# Patient Record
Sex: Female | Born: 1950 | Race: White | Hispanic: No | Marital: Married | Smoking: Never smoker
Health system: Southern US, Community
[De-identification: ages and names within clinical notes are randomized; demographics above are authoritative.]

## PROBLEM LIST (undated history)

## (undated) DIAGNOSIS — G43909 Migraine, unspecified, not intractable, without status migrainosus: Secondary | ICD-10-CM

## (undated) DIAGNOSIS — I10 Essential (primary) hypertension: Secondary | ICD-10-CM

## (undated) DIAGNOSIS — K589 Irritable bowel syndrome without diarrhea: Secondary | ICD-10-CM

---

## 1998-04-23 ENCOUNTER — Encounter: Payer: Self-pay | Admitting: Obstetrics and Gynecology

## 1998-04-23 ENCOUNTER — Ambulatory Visit (HOSPITAL_COMMUNITY): Admission: RE | Admit: 1998-04-23 | Discharge: 1998-04-23 | Payer: Self-pay | Admitting: Obstetrics and Gynecology

## 1999-06-14 ENCOUNTER — Encounter: Payer: Self-pay | Admitting: Obstetrics and Gynecology

## 1999-06-14 ENCOUNTER — Ambulatory Visit (HOSPITAL_COMMUNITY): Admission: RE | Admit: 1999-06-14 | Discharge: 1999-06-14 | Payer: Self-pay | Admitting: Obstetrics and Gynecology

## 2000-07-30 ENCOUNTER — Encounter: Payer: Self-pay | Admitting: Obstetrics and Gynecology

## 2000-07-30 ENCOUNTER — Ambulatory Visit (HOSPITAL_COMMUNITY): Admission: RE | Admit: 2000-07-30 | Discharge: 2000-07-30 | Payer: Self-pay | Admitting: Obstetrics and Gynecology

## 2001-08-05 ENCOUNTER — Encounter: Payer: Self-pay | Admitting: Obstetrics and Gynecology

## 2001-08-05 ENCOUNTER — Ambulatory Visit (HOSPITAL_COMMUNITY): Admission: RE | Admit: 2001-08-05 | Discharge: 2001-08-05 | Payer: Self-pay | Admitting: Obstetrics and Gynecology

## 2002-09-01 ENCOUNTER — Encounter: Payer: Self-pay | Admitting: Obstetrics and Gynecology

## 2002-09-01 ENCOUNTER — Ambulatory Visit (HOSPITAL_COMMUNITY): Admission: RE | Admit: 2002-09-01 | Discharge: 2002-09-01 | Payer: Self-pay | Admitting: Obstetrics and Gynecology

## 2003-10-10 ENCOUNTER — Ambulatory Visit (HOSPITAL_COMMUNITY): Admission: RE | Admit: 2003-10-10 | Discharge: 2003-10-10 | Payer: Self-pay | Admitting: Obstetrics and Gynecology

## 2004-10-16 ENCOUNTER — Ambulatory Visit (HOSPITAL_COMMUNITY): Admission: RE | Admit: 2004-10-16 | Discharge: 2004-10-16 | Payer: Self-pay | Admitting: Obstetrics and Gynecology

## 2005-10-28 ENCOUNTER — Ambulatory Visit (HOSPITAL_COMMUNITY): Admission: RE | Admit: 2005-10-28 | Discharge: 2005-10-28 | Payer: Self-pay | Admitting: Obstetrics and Gynecology

## 2005-10-28 IMAGING — MG MM DIGITAL SCREENING BILAT
5 series · 5 of 5 positions shown · non-contrast
Comparison: none

DG SCREEN MAMMOGRAM BILATERAL
Bilateral CC and MLO view(s) were taken.

SCREENING MAMMOGRAM:
There is a  dense fibroglandular pattern.  No masses or malignant type calcifications are 
identified.  Compared with prior studies.

[R CC]
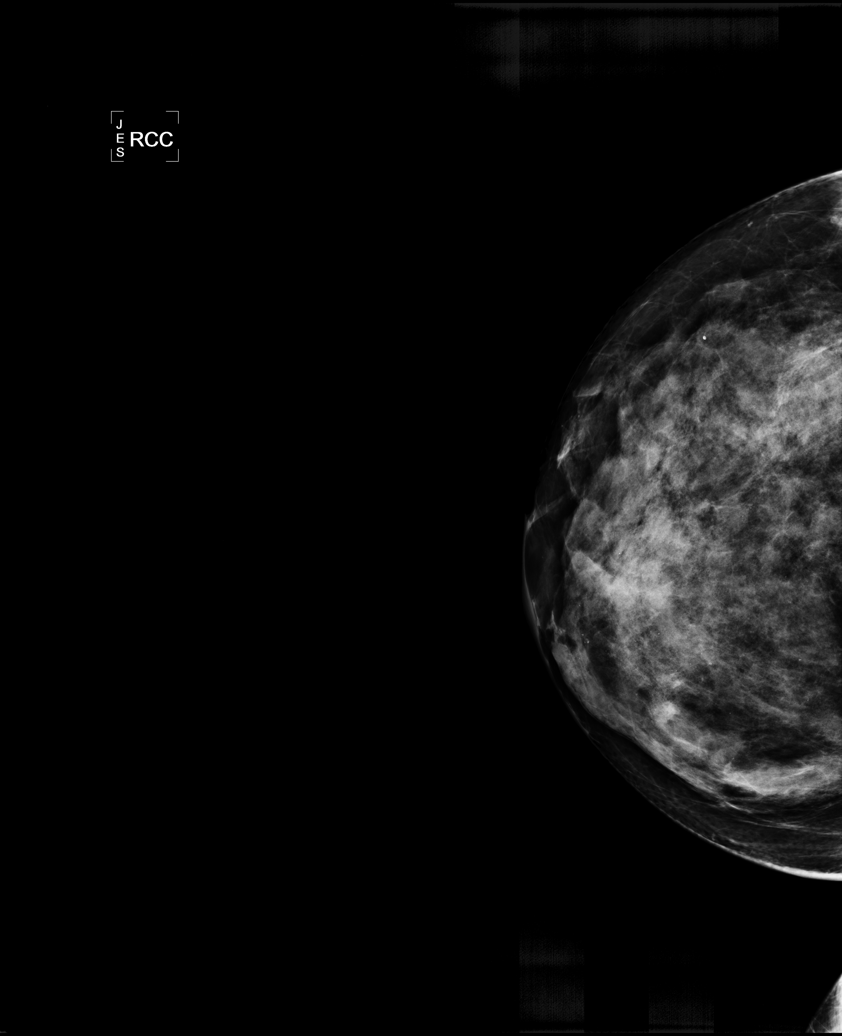

[R MLO]
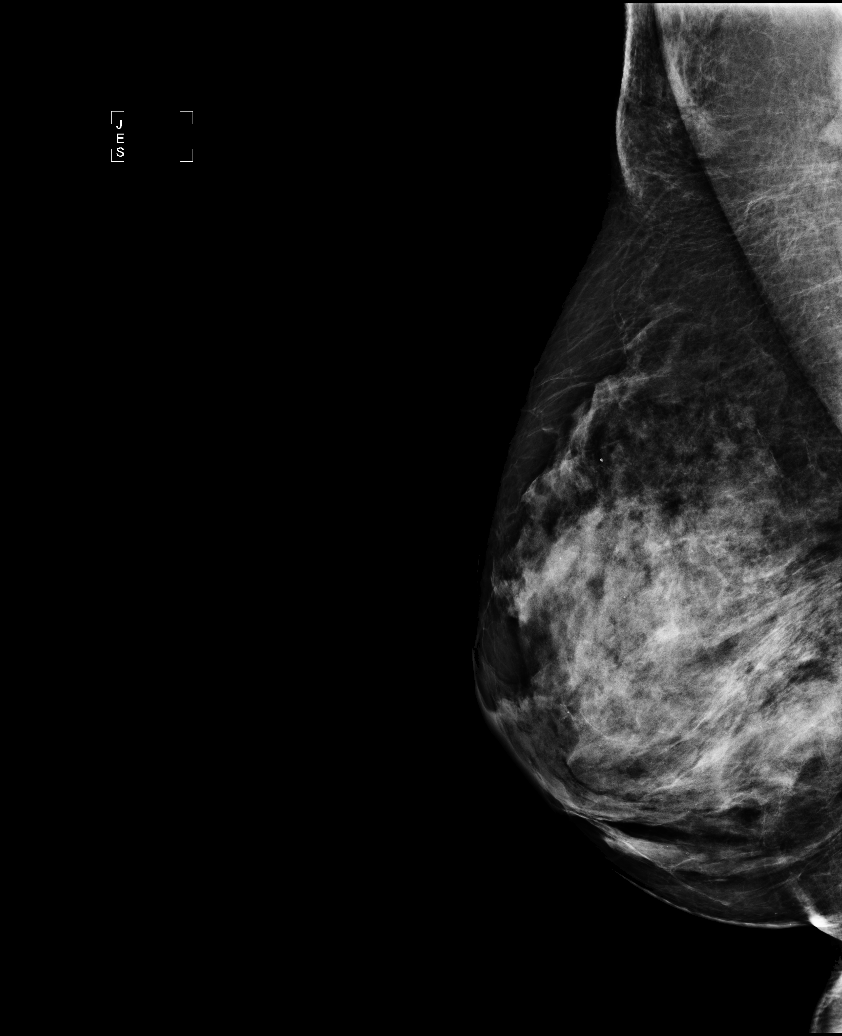

[L CC]
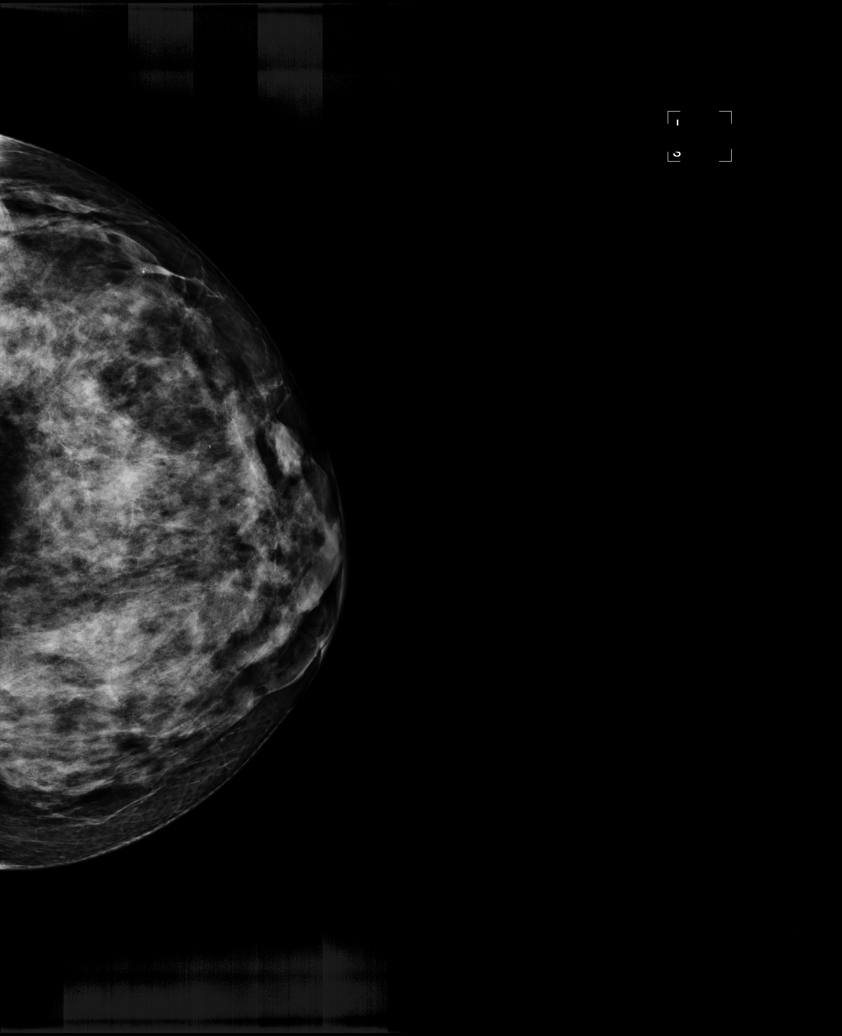

[L MLO (1 of 2)]
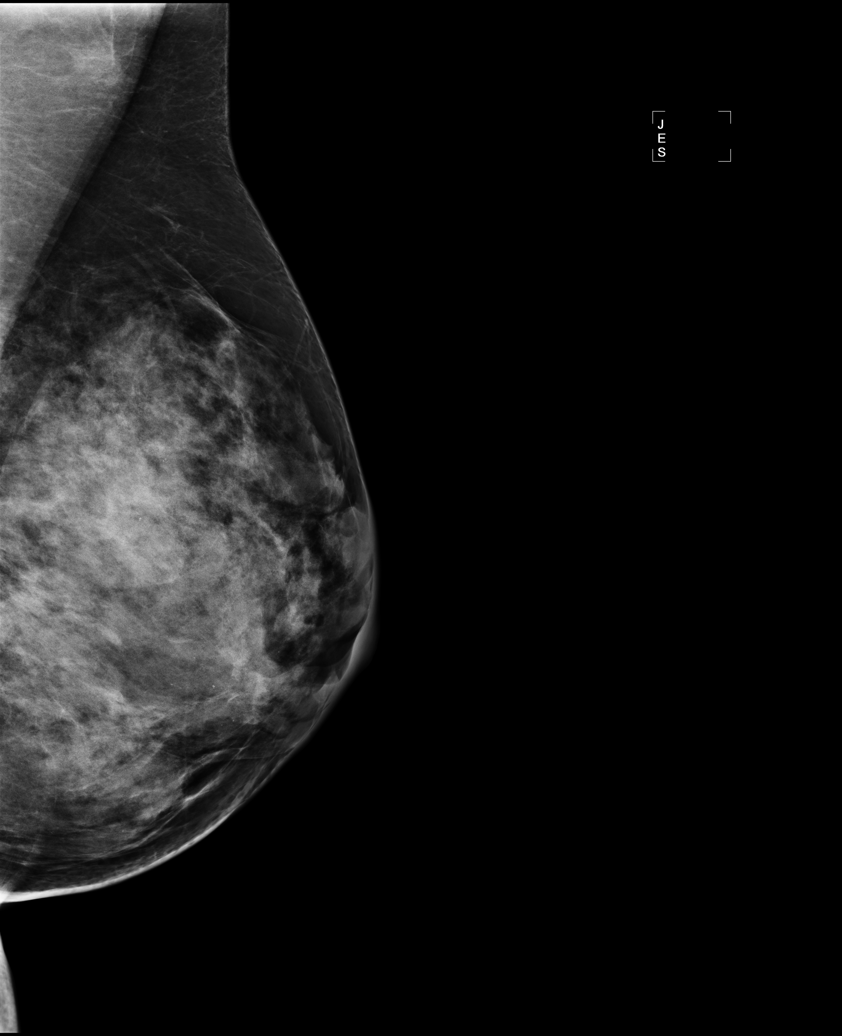

[L MLO (2 of 2)]
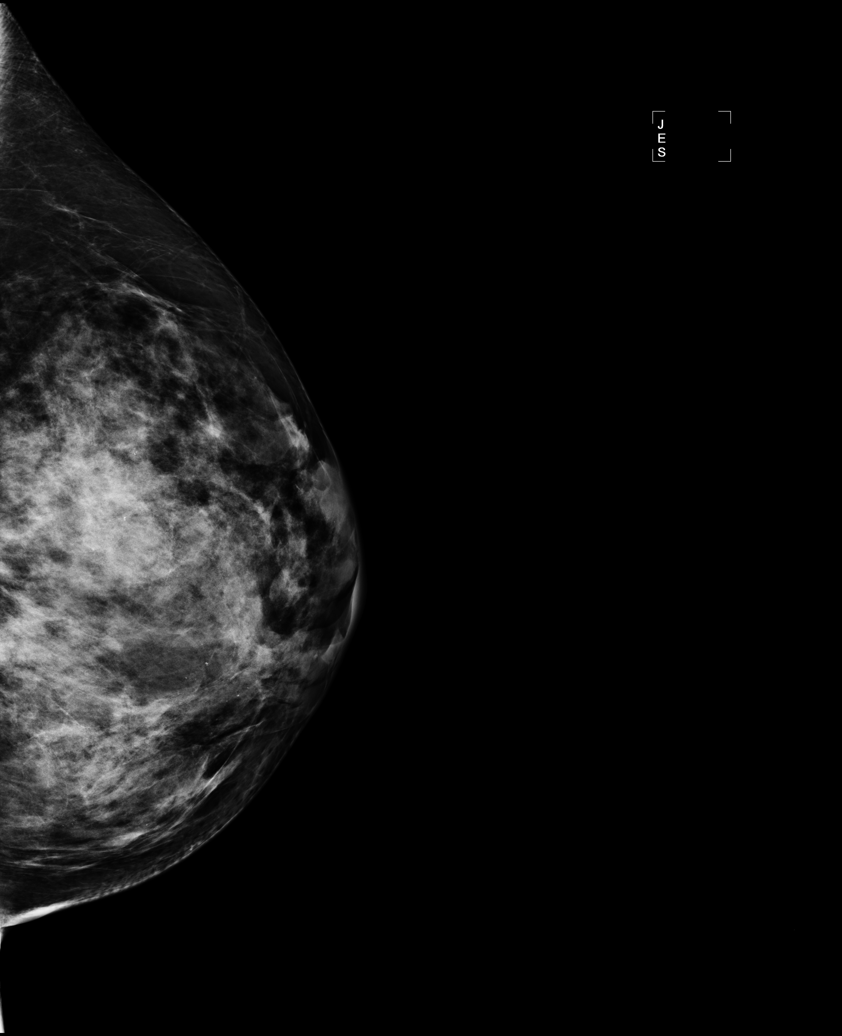

[5 of 5 positions shown; findings below may reference images not displayed]

IMPRESSION: No specific mammographic evidence of malignancy.  Next screening mammogram is recommended in one 
year.

ASSESSMENT: Negative - BI-RADS 1

Screening mammogram in 1 year.
ANALYZED BY COMPUTER AIDED DETECTION. , THIS PROCEDURE WAS A DIGITAL MAMMOGRAM.

## 2006-11-13 ENCOUNTER — Ambulatory Visit (HOSPITAL_COMMUNITY): Admission: RE | Admit: 2006-11-13 | Discharge: 2006-11-13 | Payer: Self-pay | Admitting: Family Medicine

## 2009-08-29 ENCOUNTER — Ambulatory Visit (HOSPITAL_COMMUNITY): Admission: RE | Admit: 2009-08-29 | Discharge: 2009-08-29 | Payer: Self-pay | Admitting: Obstetrics and Gynecology

## 2009-09-04 ENCOUNTER — Encounter: Admission: RE | Admit: 2009-09-04 | Discharge: 2009-09-04 | Payer: Self-pay | Admitting: Obstetrics and Gynecology

## 2011-04-07 ENCOUNTER — Other Ambulatory Visit (HOSPITAL_COMMUNITY): Payer: Self-pay | Admitting: Obstetrics and Gynecology

## 2011-04-07 DIAGNOSIS — Z1231 Encounter for screening mammogram for malignant neoplasm of breast: Secondary | ICD-10-CM

## 2011-05-06 ENCOUNTER — Ambulatory Visit (HOSPITAL_COMMUNITY)
Admission: RE | Admit: 2011-05-06 | Discharge: 2011-05-06 | Disposition: A | Discharge status: Home / Self Care | Payer: BC Managed Care – PPO | Source: Ambulatory Visit | Attending: Obstetrics and Gynecology | Admitting: Obstetrics and Gynecology

## 2011-05-06 DIAGNOSIS — Z1231 Encounter for screening mammogram for malignant neoplasm of breast: Secondary | ICD-10-CM | POA: Insufficient documentation

## 2012-06-17 ENCOUNTER — Other Ambulatory Visit (HOSPITAL_COMMUNITY): Payer: Self-pay | Admitting: Obstetrics and Gynecology

## 2012-06-17 DIAGNOSIS — Z1231 Encounter for screening mammogram for malignant neoplasm of breast: Secondary | ICD-10-CM

## 2012-06-18 ENCOUNTER — Ambulatory Visit (HOSPITAL_COMMUNITY)
Admission: RE | Admit: 2012-06-18 | Discharge: 2012-06-18 | Disposition: A | Discharge status: Home / Self Care | Payer: BC Managed Care – PPO | Source: Ambulatory Visit | Attending: Obstetrics and Gynecology | Admitting: Obstetrics and Gynecology

## 2012-06-18 DIAGNOSIS — Z1231 Encounter for screening mammogram for malignant neoplasm of breast: Secondary | ICD-10-CM | POA: Insufficient documentation

## 2017-06-02 DIAGNOSIS — Z780 Asymptomatic menopausal state: Secondary | ICD-10-CM | POA: Diagnosis not present

## 2017-06-02 DIAGNOSIS — Z1231 Encounter for screening mammogram for malignant neoplasm of breast: Secondary | ICD-10-CM | POA: Diagnosis not present

## 2017-06-02 DIAGNOSIS — Z13820 Encounter for screening for osteoporosis: Secondary | ICD-10-CM | POA: Diagnosis not present

## 2017-06-02 DIAGNOSIS — M85852 Other specified disorders of bone density and structure, left thigh: Secondary | ICD-10-CM | POA: Diagnosis not present

## 2017-09-23 DIAGNOSIS — Z0000 Encounter for general adult medical examination without abnormal findings: Secondary | ICD-10-CM | POA: Diagnosis not present

## 2017-09-23 DIAGNOSIS — G43001 Migraine without aura, not intractable, with status migrainosus: Secondary | ICD-10-CM | POA: Diagnosis not present

## 2017-09-23 DIAGNOSIS — R7989 Other specified abnormal findings of blood chemistry: Secondary | ICD-10-CM | POA: Diagnosis not present

## 2017-09-23 DIAGNOSIS — I10 Essential (primary) hypertension: Secondary | ICD-10-CM | POA: Diagnosis not present

## 2017-12-02 DIAGNOSIS — R1032 Left lower quadrant pain: Secondary | ICD-10-CM | POA: Diagnosis not present

## 2017-12-02 DIAGNOSIS — Z01419 Encounter for gynecological examination (general) (routine) without abnormal findings: Secondary | ICD-10-CM | POA: Diagnosis not present

## 2017-12-02 DIAGNOSIS — Z1151 Encounter for screening for human papillomavirus (HPV): Secondary | ICD-10-CM | POA: Diagnosis not present

## 2017-12-02 DIAGNOSIS — Z800 Family history of malignant neoplasm of digestive organs: Secondary | ICD-10-CM | POA: Diagnosis not present

## 2017-12-15 DIAGNOSIS — Z79899 Other long term (current) drug therapy: Secondary | ICD-10-CM | POA: Diagnosis not present

## 2017-12-15 DIAGNOSIS — I10 Essential (primary) hypertension: Secondary | ICD-10-CM | POA: Diagnosis not present

## 2017-12-15 DIAGNOSIS — D259 Leiomyoma of uterus, unspecified: Secondary | ICD-10-CM | POA: Diagnosis not present

## 2017-12-15 DIAGNOSIS — R1032 Left lower quadrant pain: Secondary | ICD-10-CM | POA: Diagnosis not present

## 2017-12-16 DIAGNOSIS — I10 Essential (primary) hypertension: Secondary | ICD-10-CM | POA: Diagnosis not present

## 2017-12-16 DIAGNOSIS — M542 Cervicalgia: Secondary | ICD-10-CM | POA: Diagnosis not present

## 2017-12-31 ENCOUNTER — Other Ambulatory Visit: Payer: Self-pay | Admitting: *Deleted

## 2017-12-31 NOTE — Patient Outreach (Addendum)
12/28/17-Telephone call to pt for follow up on [place] (HTA) completed by pt, spoke with pt, HIPAA verified, pt reports she sees primary care MD in [place], reports she does not smoke, does have hypertension and has had some medication changes and has blood pressure cuff and is checking blood pressure, and keeping in touch with MD for any elevated readings, changes pt verbalizes she is able to follow MD instructions about any medication changes, verbalizes understanding of low sodium diet, pt reports she has no needs at present but would like contact information for future reference, RN CM mailed successful outreach letter to pt home (with 24 hour nurse line magnet)  Jacqlyn Larsen [facility], [place] Coordinator [phone]

## 2018-01-15 DIAGNOSIS — I10 Essential (primary) hypertension: Secondary | ICD-10-CM | POA: Diagnosis not present

## 2018-01-15 DIAGNOSIS — M542 Cervicalgia: Secondary | ICD-10-CM | POA: Diagnosis not present

## 2018-07-19 DIAGNOSIS — M542 Cervicalgia: Secondary | ICD-10-CM | POA: Diagnosis not present

## 2018-07-19 DIAGNOSIS — I10 Essential (primary) hypertension: Secondary | ICD-10-CM | POA: Diagnosis not present

## 2018-07-19 DIAGNOSIS — Z1239 Encounter for other screening for malignant neoplasm of breast: Secondary | ICD-10-CM | POA: Diagnosis not present

## 2018-07-19 DIAGNOSIS — R7301 Impaired fasting glucose: Secondary | ICD-10-CM | POA: Diagnosis not present

## 2018-07-19 DIAGNOSIS — Z1159 Encounter for screening for other viral diseases: Secondary | ICD-10-CM | POA: Diagnosis not present

## 2018-07-23 DIAGNOSIS — M542 Cervicalgia: Secondary | ICD-10-CM | POA: Diagnosis not present

## 2018-07-23 DIAGNOSIS — M50323 Other cervical disc degeneration at C6-C7 level: Secondary | ICD-10-CM | POA: Diagnosis not present

## 2018-07-23 DIAGNOSIS — Z1239 Encounter for other screening for malignant neoplasm of breast: Secondary | ICD-10-CM | POA: Diagnosis not present

## 2018-07-23 DIAGNOSIS — Z1231 Encounter for screening mammogram for malignant neoplasm of breast: Secondary | ICD-10-CM | POA: Diagnosis not present

## 2019-01-18 DIAGNOSIS — I10 Essential (primary) hypertension: Secondary | ICD-10-CM | POA: Diagnosis not present

## 2019-01-18 DIAGNOSIS — Z0000 Encounter for general adult medical examination without abnormal findings: Secondary | ICD-10-CM | POA: Diagnosis not present

## 2019-01-18 DIAGNOSIS — Z23 Encounter for immunization: Secondary | ICD-10-CM | POA: Diagnosis not present

## 2019-01-18 DIAGNOSIS — R7989 Other specified abnormal findings of blood chemistry: Secondary | ICD-10-CM | POA: Diagnosis not present

## 2019-01-18 DIAGNOSIS — R7301 Impaired fasting glucose: Secondary | ICD-10-CM | POA: Diagnosis not present

## 2019-02-08 DIAGNOSIS — D0359 Melanoma in situ of other part of trunk: Secondary | ICD-10-CM | POA: Diagnosis not present

## 2019-02-08 DIAGNOSIS — L821 Other seborrheic keratosis: Secondary | ICD-10-CM | POA: Diagnosis not present

## 2019-02-08 DIAGNOSIS — L570 Actinic keratosis: Secondary | ICD-10-CM | POA: Diagnosis not present

## 2019-02-08 DIAGNOSIS — D3617 Benign neoplasm of peripheral nerves and autonomic nervous system of trunk, unspecified: Secondary | ICD-10-CM | POA: Diagnosis not present

## 2019-02-08 DIAGNOSIS — C4441 Basal cell carcinoma of skin of scalp and neck: Secondary | ICD-10-CM | POA: Diagnosis not present

## 2019-02-08 DIAGNOSIS — D485 Neoplasm of uncertain behavior of skin: Secondary | ICD-10-CM | POA: Diagnosis not present

## 2019-02-21 DIAGNOSIS — C4492 Squamous cell carcinoma of skin, unspecified: Secondary | ICD-10-CM | POA: Diagnosis not present

## 2019-03-15 DIAGNOSIS — D0359 Melanoma in situ of other part of trunk: Secondary | ICD-10-CM | POA: Diagnosis not present

## 2019-03-15 DIAGNOSIS — L820 Inflamed seborrheic keratosis: Secondary | ICD-10-CM | POA: Diagnosis not present

## 2019-03-15 DIAGNOSIS — D485 Neoplasm of uncertain behavior of skin: Secondary | ICD-10-CM | POA: Diagnosis not present

## 2019-04-26 DIAGNOSIS — L821 Other seborrheic keratosis: Secondary | ICD-10-CM | POA: Diagnosis not present

## 2019-04-26 DIAGNOSIS — Z85820 Personal history of malignant melanoma of skin: Secondary | ICD-10-CM | POA: Diagnosis not present

## 2019-04-26 DIAGNOSIS — Z08 Encounter for follow-up examination after completed treatment for malignant neoplasm: Secondary | ICD-10-CM | POA: Diagnosis not present

## 2019-04-26 DIAGNOSIS — L570 Actinic keratosis: Secondary | ICD-10-CM | POA: Diagnosis not present

## 2019-07-28 DIAGNOSIS — C44519 Basal cell carcinoma of skin of other part of trunk: Secondary | ICD-10-CM | POA: Diagnosis not present

## 2019-07-28 DIAGNOSIS — L570 Actinic keratosis: Secondary | ICD-10-CM | POA: Diagnosis not present

## 2019-10-06 DIAGNOSIS — L570 Actinic keratosis: Secondary | ICD-10-CM | POA: Diagnosis not present

## 2019-10-06 DIAGNOSIS — D485 Neoplasm of uncertain behavior of skin: Secondary | ICD-10-CM | POA: Diagnosis not present

## 2019-10-06 DIAGNOSIS — L905 Scar conditions and fibrosis of skin: Secondary | ICD-10-CM | POA: Diagnosis not present

## 2019-10-25 DIAGNOSIS — R7301 Impaired fasting glucose: Secondary | ICD-10-CM | POA: Diagnosis not present

## 2019-10-25 DIAGNOSIS — F4323 Adjustment disorder with mixed anxiety and depressed mood: Secondary | ICD-10-CM | POA: Diagnosis not present

## 2019-10-25 DIAGNOSIS — I10 Essential (primary) hypertension: Secondary | ICD-10-CM | POA: Diagnosis not present

## 2019-10-25 DIAGNOSIS — C4359 Malignant melanoma of other part of trunk: Secondary | ICD-10-CM | POA: Diagnosis not present

## 2019-11-15 DIAGNOSIS — Z85828 Personal history of other malignant neoplasm of skin: Secondary | ICD-10-CM | POA: Diagnosis not present

## 2019-11-15 DIAGNOSIS — L570 Actinic keratosis: Secondary | ICD-10-CM | POA: Diagnosis not present

## 2019-11-15 DIAGNOSIS — Z85820 Personal history of malignant melanoma of skin: Secondary | ICD-10-CM | POA: Diagnosis not present

## 2019-11-15 DIAGNOSIS — L821 Other seborrheic keratosis: Secondary | ICD-10-CM | POA: Diagnosis not present

## 2019-11-15 DIAGNOSIS — D1801 Hemangioma of skin and subcutaneous tissue: Secondary | ICD-10-CM | POA: Diagnosis not present

## 2020-01-03 DIAGNOSIS — Z1231 Encounter for screening mammogram for malignant neoplasm of breast: Secondary | ICD-10-CM | POA: Diagnosis not present

## 2020-03-27 DIAGNOSIS — L821 Other seborrheic keratosis: Secondary | ICD-10-CM | POA: Diagnosis not present

## 2020-03-27 DIAGNOSIS — D485 Neoplasm of uncertain behavior of skin: Secondary | ICD-10-CM | POA: Diagnosis not present

## 2020-03-27 DIAGNOSIS — L820 Inflamed seborrheic keratosis: Secondary | ICD-10-CM | POA: Diagnosis not present

## 2020-05-15 DIAGNOSIS — L821 Other seborrheic keratosis: Secondary | ICD-10-CM | POA: Diagnosis not present

## 2020-05-15 DIAGNOSIS — Z85820 Personal history of malignant melanoma of skin: Secondary | ICD-10-CM | POA: Diagnosis not present

## 2020-05-15 DIAGNOSIS — D225 Melanocytic nevi of trunk: Secondary | ICD-10-CM | POA: Diagnosis not present

## 2020-06-06 DIAGNOSIS — R7301 Impaired fasting glucose: Secondary | ICD-10-CM | POA: Diagnosis not present

## 2020-06-06 DIAGNOSIS — B009 Herpesviral infection, unspecified: Secondary | ICD-10-CM | POA: Diagnosis not present

## 2020-06-06 DIAGNOSIS — H9192 Unspecified hearing loss, left ear: Secondary | ICD-10-CM | POA: Diagnosis not present

## 2020-06-06 DIAGNOSIS — I10 Essential (primary) hypertension: Secondary | ICD-10-CM | POA: Diagnosis not present

## 2020-06-06 DIAGNOSIS — Z87828 Personal history of other (healed) physical injury and trauma: Secondary | ICD-10-CM | POA: Diagnosis not present

## 2020-06-06 DIAGNOSIS — R7989 Other specified abnormal findings of blood chemistry: Secondary | ICD-10-CM | POA: Diagnosis not present

## 2020-11-15 DIAGNOSIS — L570 Actinic keratosis: Secondary | ICD-10-CM | POA: Diagnosis not present

## 2020-11-15 DIAGNOSIS — Z85820 Personal history of malignant melanoma of skin: Secondary | ICD-10-CM | POA: Diagnosis not present

## 2020-11-15 DIAGNOSIS — Z85828 Personal history of other malignant neoplasm of skin: Secondary | ICD-10-CM | POA: Diagnosis not present

## 2020-11-15 DIAGNOSIS — L821 Other seborrheic keratosis: Secondary | ICD-10-CM | POA: Diagnosis not present

## 2020-11-15 DIAGNOSIS — L218 Other seborrheic dermatitis: Secondary | ICD-10-CM | POA: Diagnosis not present

## 2020-12-04 DIAGNOSIS — R7989 Other specified abnormal findings of blood chemistry: Secondary | ICD-10-CM | POA: Diagnosis not present

## 2020-12-04 DIAGNOSIS — M25561 Pain in right knee: Secondary | ICD-10-CM | POA: Diagnosis not present

## 2020-12-04 DIAGNOSIS — Z8249 Family history of ischemic heart disease and other diseases of the circulatory system: Secondary | ICD-10-CM | POA: Diagnosis not present

## 2020-12-04 DIAGNOSIS — I10 Essential (primary) hypertension: Secondary | ICD-10-CM | POA: Diagnosis not present

## 2020-12-04 DIAGNOSIS — R7301 Impaired fasting glucose: Secondary | ICD-10-CM | POA: Diagnosis not present

## 2020-12-04 DIAGNOSIS — M25562 Pain in left knee: Secondary | ICD-10-CM | POA: Diagnosis not present

## 2020-12-04 DIAGNOSIS — R7303 Prediabetes: Secondary | ICD-10-CM | POA: Diagnosis not present

## 2020-12-04 DIAGNOSIS — F419 Anxiety disorder, unspecified: Secondary | ICD-10-CM | POA: Diagnosis not present

## 2020-12-04 DIAGNOSIS — Z79899 Other long term (current) drug therapy: Secondary | ICD-10-CM | POA: Diagnosis not present

## 2020-12-04 DIAGNOSIS — M2550 Pain in unspecified joint: Secondary | ICD-10-CM | POA: Diagnosis not present

## 2020-12-04 DIAGNOSIS — Z8639 Personal history of other endocrine, nutritional and metabolic disease: Secondary | ICD-10-CM | POA: Diagnosis not present

## 2020-12-04 DIAGNOSIS — Z0000 Encounter for general adult medical examination without abnormal findings: Secondary | ICD-10-CM | POA: Diagnosis not present

## 2020-12-04 DIAGNOSIS — Z0001 Encounter for general adult medical examination with abnormal findings: Secondary | ICD-10-CM | POA: Diagnosis not present

## 2020-12-04 DIAGNOSIS — H539 Unspecified visual disturbance: Secondary | ICD-10-CM | POA: Diagnosis not present

## 2020-12-04 DIAGNOSIS — H9190 Unspecified hearing loss, unspecified ear: Secondary | ICD-10-CM | POA: Diagnosis not present

## 2021-05-02 DIAGNOSIS — C44712 Basal cell carcinoma of skin of right lower limb, including hip: Secondary | ICD-10-CM | POA: Diagnosis not present

## 2022-10-16 ENCOUNTER — Emergency Department (HOSPITAL_BASED_OUTPATIENT_CLINIC_OR_DEPARTMENT_OTHER): Payer: HMO

## 2022-10-16 ENCOUNTER — Encounter (HOSPITAL_BASED_OUTPATIENT_CLINIC_OR_DEPARTMENT_OTHER): Payer: Self-pay

## 2022-10-16 ENCOUNTER — Inpatient Hospital Stay (HOSPITAL_BASED_OUTPATIENT_CLINIC_OR_DEPARTMENT_OTHER)
Admission: EM | Admit: 2022-10-16 | Discharge: 2022-10-18 | DRG: 390 | Disposition: A | Discharge status: Home / Self Care | Payer: HMO | Attending: Family Medicine | Admitting: Family Medicine

## 2022-10-16 ENCOUNTER — Other Ambulatory Visit: Payer: Self-pay

## 2022-10-16 DIAGNOSIS — Z87891 Personal history of nicotine dependence: Secondary | ICD-10-CM | POA: Diagnosis not present

## 2022-10-16 DIAGNOSIS — F1290 Cannabis use, unspecified, uncomplicated: Secondary | ICD-10-CM | POA: Diagnosis present

## 2022-10-16 DIAGNOSIS — E860 Dehydration: Secondary | ICD-10-CM | POA: Diagnosis present

## 2022-10-16 DIAGNOSIS — K219 Gastro-esophageal reflux disease without esophagitis: Secondary | ICD-10-CM | POA: Diagnosis present

## 2022-10-16 DIAGNOSIS — I10 Essential (primary) hypertension: Secondary | ICD-10-CM | POA: Diagnosis present

## 2022-10-16 DIAGNOSIS — Z800 Family history of malignant neoplasm of digestive organs: Secondary | ICD-10-CM

## 2022-10-16 DIAGNOSIS — D72829 Elevated white blood cell count, unspecified: Secondary | ICD-10-CM | POA: Diagnosis present

## 2022-10-16 DIAGNOSIS — Z79899 Other long term (current) drug therapy: Secondary | ICD-10-CM

## 2022-10-16 DIAGNOSIS — K589 Irritable bowel syndrome without diarrhea: Secondary | ICD-10-CM | POA: Diagnosis present

## 2022-10-16 DIAGNOSIS — K56609 Unspecified intestinal obstruction, unspecified as to partial versus complete obstruction: Principal | ICD-10-CM | POA: Diagnosis present

## 2022-10-16 DIAGNOSIS — K2980 Duodenitis without bleeding: Secondary | ICD-10-CM | POA: Diagnosis present

## 2022-10-16 DIAGNOSIS — R1011 Right upper quadrant pain: Principal | ICD-10-CM

## 2022-10-16 HISTORY — DX: Irritable bowel syndrome, unspecified: K58.9

## 2022-10-16 HISTORY — DX: Essential (primary) hypertension: I10

## 2022-10-16 HISTORY — DX: Migraine, unspecified, not intractable, without status migrainosus: G43.909

## 2022-10-16 LAB — COMPREHENSIVE METABOLIC PANEL
ALT: 27 U/L (ref 0–44)
AST: 34 U/L (ref 15–41)
Albumin: 4.6 g/dL (ref 3.5–5.0)
Alkaline Phosphatase: 81 U/L (ref 38–126)
Anion gap: 12 (ref 5–15)
BUN: 21 mg/dL (ref 8–23)
CO2: 25 mmol/L (ref 22–32)
Calcium: 10.6 mg/dL — ABNORMAL HIGH (ref 8.9–10.3)
Chloride: 101 mmol/L (ref 98–111)
Creatinine, Ser: 0.83 mg/dL (ref 0.44–1.00)
GFR, Estimated: >60 mL/min (ref >60)
Glucose, Bld: 190 mg/dL — ABNORMAL HIGH (ref 70–99)
Potassium: 4.2 mmol/L (ref 3.5–5.1)
Sodium: 138 mmol/L (ref 135–145)
Total Bilirubin: 0.8 mg/dL (ref 0.3–1.2)
Total Protein: 8.2 g/dL — ABNORMAL HIGH (ref 6.5–8.1)

## 2022-10-16 LAB — URINALYSIS, ROUTINE W REFLEX MICROSCOPIC
Bilirubin Urine: NEGATIVE
Glucose, UA: NEGATIVE mg/dL
Hgb urine dipstick: NEGATIVE
Ketones, ur: 15 mg/dL — AB
Leukocytes,Ua: NEGATIVE
Nitrite: NEGATIVE
Protein, ur: 100 mg/dL — AB
Specific Gravity, Urine: 1.02 (ref 1.005–1.030)
pH: 7 (ref 5.0–8.0)

## 2022-10-16 LAB — CBC
HCT: 44.6 % (ref 36.0–46.0)
Hemoglobin: 15.3 g/dL — ABNORMAL HIGH (ref 12.0–15.0)
MCH: 29.8 pg (ref 26.0–34.0)
MCHC: 34.3 g/dL (ref 30.0–36.0)
MCV: 86.9 fL (ref 80.0–100.0)
Platelets: 250 10*3/uL (ref 150–400)
RBC: 5.13 MIL/uL — ABNORMAL HIGH (ref 3.87–5.11)
RDW: 13.2 % (ref 11.5–15.5)
WBC: 15.1 10*3/uL — ABNORMAL HIGH (ref 4.0–10.5)
nRBC: 0 % (ref 0.0–0.2)

## 2022-10-16 LAB — URINALYSIS, MICROSCOPIC (REFLEX)

## 2022-10-16 LAB — LIPASE, BLOOD: Lipase: 41 U/L (ref 11–51)

## 2022-10-16 MED ORDER — ONDANSETRON HCL 4 MG/2ML IJ SOLN
4.0000 mg | Freq: Once | INTRAMUSCULAR | Status: AC
  Administered 2022-10-16: 4 mg via INTRAVENOUS
  Filled 2022-10-16: qty 2

## 2022-10-16 MED ORDER — FENTANYL CITRATE PF 50 MCG/ML IJ SOSY
100.0000 ug | PREFILLED_SYRINGE | Freq: Once | INTRAMUSCULAR | Status: AC
  Administered 2022-10-16: 100 ug via INTRAVENOUS
  Filled 2022-10-16: qty 2

## 2022-10-16 MED ORDER — LACTATED RINGERS IV BOLUS
1000.0000 mL | Freq: Once | INTRAVENOUS | Status: AC
  Administered 2022-10-16: 1000 mL via INTRAVENOUS

## 2022-10-16 MED ORDER — ONDANSETRON HCL 4 MG/2ML IJ SOLN
4.0000 mg | Freq: Four times a day (QID) | INTRAMUSCULAR | Status: DC | PRN
  Administered 2022-10-17: 4 mg via INTRAVENOUS
  Filled 2022-10-16: qty 2

## 2022-10-16 MED ORDER — HYDROMORPHONE HCL 1 MG/ML IJ SOLN
0.5000 mg | Freq: Once | INTRAMUSCULAR | Status: AC
  Administered 2022-10-16: 0.5 mg via INTRAVENOUS
  Filled 2022-10-16: qty 1

## 2022-10-16 MED ORDER — FAMOTIDINE IN NACL 20-0.9 MG/50ML-% IV SOLN
20.0000 mg | Freq: Once | INTRAVENOUS | Status: AC
  Administered 2022-10-16: 20 mg via INTRAVENOUS
  Filled 2022-10-16: qty 50

## 2022-10-16 MED ORDER — PANTOPRAZOLE SODIUM 40 MG IV SOLR
40.0000 mg | Freq: Once | INTRAVENOUS | Status: AC
  Administered 2022-10-16: 40 mg via INTRAVENOUS
  Filled 2022-10-16: qty 10

## 2022-10-16 MED ORDER — MORPHINE SULFATE (PF) 2 MG/ML IV SOLN
1.0000 mg | INTRAVENOUS | Status: DC | PRN
  Administered 2022-10-17: 1 mg via INTRAVENOUS
  Filled 2022-10-16: qty 1

## 2022-10-16 MED ORDER — MORPHINE SULFATE (PF) 4 MG/ML IV SOLN
4.0000 mg | Freq: Once | INTRAVENOUS | Status: AC
  Administered 2022-10-16: 4 mg via INTRAVENOUS
  Filled 2022-10-16: qty 1

## 2022-10-16 MED ORDER — IOHEXOL 350 MG/ML SOLN
100.0000 mL | Freq: Once | INTRAVENOUS | Status: AC | PRN
  Administered 2022-10-16: 75 mL via INTRAVENOUS

## 2022-10-16 MED ORDER — SODIUM CHLORIDE 0.9 % IV BOLUS (SEPSIS)
1000.0000 mL | Freq: Once | INTRAVENOUS | Status: AC
  Administered 2022-10-16: 1000 mL via INTRAVENOUS

## 2022-10-16 MED ORDER — ENOXAPARIN SODIUM 40 MG/0.4ML IJ SOSY: 40.0000 mg | PREFILLED_SYRINGE | INTRAMUSCULAR | Status: DC

## 2022-10-16 MED ORDER — SODIUM CHLORIDE 0.9% FLUSH
3.0000 mL | Freq: Two times a day (BID) | INTRAVENOUS | Status: DC
  Administered 2022-10-17: 3 mL via INTRAVENOUS

## 2022-10-16 MED ORDER — MORPHINE SULFATE (PF) 4 MG/ML IV SOLN
4.0000 mg | Freq: Once | INTRAVENOUS | Status: AC | PRN
  Administered 2022-10-16: 4 mg via INTRAVENOUS
  Filled 2022-10-16: qty 1

## 2022-10-16 MED ORDER — SODIUM CHLORIDE 0.9 % IV SOLN: INTRAVENOUS | Status: AC

## 2022-10-16 MED ORDER — HYDRALAZINE HCL 20 MG/ML IJ SOLN: 10.0000 mg | INTRAMUSCULAR | Status: DC | PRN

## 2022-10-16 NOTE — Assessment & Plan Note (Signed)
Likely reactive in setting of SBO and duodenitis.  Continue to monitor.

## 2022-10-16 NOTE — Assessment & Plan Note (Signed)
Chronic and mild, monitored by her PCP as an outpatient.

## 2022-10-16 NOTE — ED Triage Notes (Signed)
States since 0430 started having epigastric pain with nausea, emesis, belching. Last BM yesterday.

## 2022-10-16 NOTE — H&P (Signed)
History and Physical    Samantha Sampson ZOX:[id number] DOB: 07-Mar-1951 DOA: 10/16/2022  PCP: Shellia Cleverly, PA  Patient coming from: Home  I have personally briefly reviewed patient's old medical records in [facility]  Chief Complaint: Abdominal pain with nausea and vomiting  HPI: Samantha Sampson is a 72 y.o. female with medical history significant for HTN, IBS, chronic mild hypercalcemia who presented to the ED for evaluation of abdominal pain associated with nausea and vomiting.  Patient reports new onset midline abdominal pain beginning 4 AM morning of 5/23.  This felt a little different than her usual IBS symptoms however she tried to manage the same way with medications for indigestion as well as ginger ale.  Symptoms did not improve and became severe throughout the day therefore she went to the ED for further evaluation.  She did have associated nausea and vomiting at home and while in the ED which has since improved.  Last bowel movement was yesterday.  She is passing some flatus.  She denies any prior history of bowel obstruction.  She denies any prior intra-abdominal surgeries other than tubal ligation.  [facility] ED Course  Labs/Imaging on admission: I have personally reviewed following labs and imaging studies.  Initial vitals showed BP 155/74, pulse 64, RR 16, temp 98.5 F, SpO2 100% on room air.  Labs show WBC 15.1, hemoglobin 15.3, platelets 250,000, sodium 138, potassium 4.2, bicarb 25, BUN 21, creatinine 0.3, serum glucose 190, calcium 10.6, albumin 4.6, LFTs within normal limits, lipase 41.  UA negative for UTI.  RUQ ultrasound negative for cholelithiasis or cholecystitis, no biliary ductal dilatation.  Liver unremarkable.  CTA chest/abdomen/pelvis negative for evidence of aneurysm, dissection, or other acute aortic pathology.  Findings consistent with SBO, possibly due to adhesions, internal herniation not excluded per radiology report additional  findings suspicious for infectious or inflammatory duodenitis also noted.  Small volume ascites seen, felt reactive.  Patient was given IV morphine 4 mg x 3, IV Dilaudid 0.5 mg x 1, IV fentanyl 100 mcg x 1, IV Zofran, Protonix, Pepcid, and 2 L IV fluids.  EDP discussed with on-call general surgery, Dr. Luisa Hart, who recommended conservative management with medical admission.  The hospitalist service was consulted to admit for further evaluation and management.  Review of Systems: All systems reviewed and are negative except as documented in history of present illness above.   Past Medical History:  Diagnosis Date   Hypertension    IBS (irritable bowel syndrome)     History reviewed. No pertinent surgical history.  Social History:  reports that she has never smoked. She has never used smokeless tobacco. She reports current alcohol use. She reports current drug use. Drug: Marijuana.  No Known Allergies  History reviewed. No pertinent family history.   Prior to Admission medications   Not on File    Physical Exam: Vitals:   10/16/22 1930 10/16/22 2030 10/16/22 2105 10/16/22 2212  BP: (!) 142/75 (!) 147/61  (!) 140/60  Pulse: 70 60  72  Resp:  16  18  Temp:   98 F (36.7 C) 99 F (37.2 C)  TempSrc:   Oral Oral  SpO2: 96% 99%  100%  Weight:      Height:       Constitutional: Resting in bed, NAD, calm, comfortable Eyes: EOMI, lids and conjunctivae normal ENMT: Mucous membranes are moist. Posterior pharynx clear of any exudate or lesions.Normal dentition.  Neck: normal, supple, no masses. Respiratory: clear  to auscultation bilaterally, no wheezing, no crackles. Normal respiratory effort. No accessory muscle use.  Cardiovascular: Regular rate and rhythm, no murmurs / rubs / gallops. No extremity edema. 2+ pedal pulses. Abdomen: no tenderness, no masses palpated. Musculoskeletal: no clubbing / cyanosis. No joint deformity upper and lower extremities. Good ROM, no contractures.  Normal muscle tone.  Skin: no rashes, lesions, ulcers. No induration Neurologic: Sensation intact. Strength 5/5 in all 4.  Psychiatric: Normal judgment and insight. Alert and oriented x 3. Normal mood.   EKG: Personally reviewed. Sinus rhythm, rate 82, no acute ischemic changes.  Prior for comparison.  Assessment/Plan Principal Problem:   Small bowel obstruction (HCC) Active Problems:   Hypercalcemia   Leukocytosis   Hypertension   Samantha Sampson is a 72 y.o. female with medical history significant for HTN, IBS, chronic mild hypercalcemia who is admitted with small bowel obstruction.  Assessment and Plan: * Small bowel obstruction (HCC) CT consistent with small bowel obstruction.  Pain and nausea significantly improved on transfer to floor. -Keep n.p.o. -Continue IV fluid hydration overnight -Continue IV antiemetics and analgesics as needed -General surgery to follow  Hypercalcemia Chronic and mild, monitored by her PCP as an outpatient.  Hypertension Holding amlodipine and Toprol XL while NPO.  Using IV hydralazine as needed.  Leukocytosis Likely reactive in setting of SBO and duodenitis.  Continue to monitor.  DVT prophylaxis: enoxaparin (LOVENOX) injection 40 mg Start: 10/17/22 2200 Code Status: Full code, confirmed with patient on admission Family Communication: Husband at bedside Disposition Plan: From home, dispo pending clinical progress Consults called: EDP discussed with general surgery, Dr. Luisa Hart Severity of Illness: The appropriate patient status for this patient is INPATIENT. Inpatient status is judged to be reasonable and necessary in order to provide the required intensity of service to ensure the patient's safety. The patient's presenting symptoms, physical exam findings, and initial radiographic and laboratory data in the context of their chronic comorbidities is felt to place them at high risk for further clinical deterioration. Furthermore, it is not  anticipated that the patient will be medically stable for discharge from the hospital within 2 midnights of admission.   * I certify that at the point of admission it is my clinical judgment that the patient will require inpatient hospital care spanning beyond 2 midnights from the point of admission due to high intensity of service, high risk for further deterioration and high frequency of surveillance required.Darreld Mclean MD [facility]  If 7PM-7AM, please contact night-coverage [email or web address]  10/16/2022, 11:26 PM

## 2022-10-16 NOTE — ED Notes (Signed)
Pt actively vomiting in triage, writhing in pain, unable to sit still. Requesting pain meds, informed her she has to wait for doctor to order them. Zofran ordered and given.

## 2022-10-16 NOTE — ED Notes (Signed)
[facility] Called for transport

## 2022-10-16 NOTE — ED Provider Notes (Signed)
Care of patient received from prior provider at 3:09 PM, please see their note for complete H/P and care plan.  Received handoff per ED course.  Clinical Course as of 10/16/22 1509  Thu Oct 16, 2022  1454 Labs remarkable for elevated white blood cell count 15.1 as well as elevated hemoglobin 15.3 likely related to hemoconcentration from fluid losses and decreased p.o. intake and vomiting.  UA consistent with dehydration with ketones present and protein present.  Glucose is 190 no anion gap and normal bicarbonate not concern for DKA.  No transaminitis.  Normal T. bili 0.8.  Normal lipase 41, not concern for pancreatitis. [VB]  1508 Handoff report: Stable 72 year old female with a chief complaint of abdominal pain Exquisite abdominal pain on exam, white count 15.  Right upper quadrant ultrasound is ordered if nondiagnostic we will plan to proceed to CT for further diagnostic care and management given concerning nature of initial presentation.  LFTs reassuring patient being treated at this time will reassess. [CC]    Clinical Course User Index [CC] Glyn Ade, MD [VB] Mardene Sayer, MD    Reassessment: Consulted general surgery, consulted medicine for definitive care and management of small bowel obstruction. Disposition:   Based on the above findings, I believe this patient is stable for admission.    Patient/family educated about specific findings on our evaluation and explained exact reasons for admission.  Patient/family educated about clinical situation and time was allowed to answer questions.   Admission team communicated with and agreed with need for admission. Patient admitted. Patient ready to move at this time.     Emergency Department Medication Summary:   Medications  fentaNYL (SUBLIMAZE) injection 100 mcg (has no administration in time range)  ondansetron (ZOFRAN) injection 4 mg (4 mg Intravenous Given 10/16/22 1403)  sodium chloride 0.9 % bolus 1,000 mL (0 mLs  Intravenous Stopped 10/16/22 1524)  morphine (PF) 4 MG/ML injection 4 mg (4 mg Intravenous Given 10/16/22 1417)  morphine (PF) 4 MG/ML injection 4 mg (4 mg Intravenous Given 10/16/22 1430)  famotidine (PEPCID) IVPB 20 mg premix (0 mg Intravenous Stopped 10/16/22 1524)  HYDROmorphone (DILAUDID) injection 0.5 mg (0.5 mg Intravenous Given 10/16/22 1527)  pantoprazole (PROTONIX) injection 40 mg (40 mg Intravenous Given 10/16/22 1743)  lactated ringers bolus 1,000 mL (0 mLs Intravenous Stopped 10/16/22 1837)  morphine (PF) 4 MG/ML injection 4 mg (4 mg Intravenous Given 10/16/22 1743)  iohexol (OMNIPAQUE) 350 MG/ML injection 100 mL (75 mLs Intravenous Contrast Given 10/16/22 1725)            Glyn Ade, MD 10/16/22 [phone]

## 2022-10-16 NOTE — ED Notes (Signed)
Pt expressed no relief in pain. EDP notified

## 2022-10-16 NOTE — Assessment & Plan Note (Addendum)
Holding amlodipine and Toprol XL while NPO.  Using IV hydralazine as needed.

## 2022-10-16 NOTE — ED Notes (Signed)
ED TO INPATIENT HANDOFF REPORT  ED Nurse Name and Phone #: Janelle Floor [phone]  S Name/Age/Gender Samantha Sampson 72 y.o. female Room/Bed: MH03/MH03  Code Status   Code Status: Not on file  Home/SNF/Other Home Patient oriented to: CA&O x4 Is this baseline? Yes   Triage Complete: Triage complete  Chief Complaint Small bowel obstruction (HCC) [K56.609]  Triage Note States since 0430 started having epigastric pain with nausea, emesis, belching. Last BM yesterday.    Allergies No Known Allergies  Level of Care/Admitting Diagnosis ED Disposition     ED Disposition  Admit   Condition  --   Comment  Hospital Area: [facility] [[id number]]  Level of Care: Med-Surg [16]  May admit patient to [facility] or [facility] if equivalent level of care is available:: Yes  Interfacility transfer: Yes  Covid Evaluation: Asymptomatic - no recent exposure (last 10 days) testing not required  Diagnosis: Small bowel obstruction [facility]) [[id number]]  Admitting Physician: Arlean Hopping [[id number]]  Attending Physician: Glyn Ade [[id number]]  Certification:: I certify this patient will need inpatient services for at least 2 midnights  Estimated Length of Stay: 3          B Medical/Surgery History Past Medical History:  Diagnosis Date   Hypertension    IBS (irritable bowel syndrome)    History reviewed. No pertinent surgical history.   A IV Location/Drains/Wounds Patient Lines/Drains/Airways Status     Active Line/Drains/Airways     Name Placement date Placement time Site Days   Peripheral IV 10/16/22 20 G Left Antecubital 10/16/22  1403  Antecubital  less than 1            Intake/Output Last 24 hours No intake or output data in the 24 hours ending 10/16/22 2135  Labs/Imaging Results for orders placed or performed during the hospital encounter of 10/16/22 (from the past 48 hour(s))  Urinalysis, Routine w reflex microscopic -Urine, Clean  Catch     Status: Abnormal   Collection Time: 10/16/22  1:49 PM  Result Value Ref Range   Color, Urine YELLOW YELLOW   APPearance CLEAR CLEAR   Specific Gravity, Urine 1.020 1.005 - 1.030   pH 7.0 5.0 - 8.0   Glucose, UA NEGATIVE NEGATIVE mg/dL   Hgb urine dipstick NEGATIVE NEGATIVE   Bilirubin Urine NEGATIVE NEGATIVE   Ketones, ur 15 (A) NEGATIVE mg/dL   Protein, ur 865 (A) NEGATIVE mg/dL   Nitrite NEGATIVE NEGATIVE   Leukocytes,Ua NEGATIVE NEGATIVE    Comment: Performed at [facility], [address] [place], [place] [address]  Urinalysis, Microscopic (reflex)     Status: Abnormal   Collection Time: 10/16/22  1:49 PM  Result Value Ref Range   RBC / HPF 0-5 0 - 5 RBC/hpf   WBC, UA 0-5 0 - 5 WBC/hpf   Bacteria, UA RARE (A) NONE SEEN   Squamous Epithelial / HPF 0-5 0 - 5 /HPF   Hyaline Casts, UA PRESENT     Comment: Performed at [facility], [address] [place], [place] [address]  Lipase, blood     Status: None   Collection Time: 10/16/22  2:05 PM  Result Value Ref Range   Lipase 41 11 - 51 U/L    Comment: Performed at [facility], [address] [place], [place] [address]  Comprehensive metabolic panel     Status: Abnormal   Collection Time: 10/16/22  2:05 PM  Result Value  Ref Range   Sodium 138 135 - 145 mmol/L   Potassium 4.2 3.5 - 5.1 mmol/L   Chloride 101 98 - 111 mmol/L   CO2 25 22 - 32 mmol/L   Glucose, Bld 190 (H) 70 - 99 mg/dL    Comment: Glucose reference range applies only to samples taken after fasting for at least 8 hours.   BUN 21 8 - 23 mg/dL   Creatinine, Ser 1.61 0.44 - 1.00 mg/dL   Calcium 09.6 (H) 8.9 - 10.3 mg/dL   Total Protein 8.2 (H) 6.5 - 8.1 g/dL   Albumin 4.6 3.5 - 5.0 g/dL   AST 34 15 - 41 U/L   ALT 27 0 - 44 U/L   Alkaline Phosphatase 81 38 - 126 U/L   Total Bilirubin 0.8 0.3 - 1.2 mg/dL   GFR, Estimated >04 >54 mL/min    Comment: (NOTE) Calculated using the CKD-EPI Creatinine Equation (2021)     Anion gap 12 5 - 15    Comment: Performed at [facility], [address] [place], [place] [address]  CBC     Status: Abnormal   Collection Time: 10/16/22  2:05 PM  Result Value Ref Range   WBC 15.1 (H) 4.0 - 10.5 K/uL   RBC 5.13 (H) 3.87 - 5.11 MIL/uL   Hemoglobin 15.3 (H) 12.0 - 15.0 g/dL   HCT 91.4 78.2 - 95.6 %   MCV 86.9 80.0 - 100.0 fL   MCH 29.8 26.0 - 34.0 pg   MCHC 34.3 30.0 - 36.0 g/dL   RDW 21.3 08.6 - 57.8 %   Platelets 250 150 - 400 K/uL   nRBC 0.0 0.0 - 0.2 %    Comment: Performed at [facility], [address] [place], [place] [address]   CT ANGIO CHEST/ABD/PEL FOR DISSECTION W &/OR WO CONTRAST  Result Date: 10/16/2022 CLINICAL DATA:  Acute aortic syndrome suspected, central abdominal pain radiating to back for 1 day EXAM: CT ANGIOGRAPHY CHEST, ABDOMEN AND PELVIS TECHNIQUE: Non-contrast CT of the chest was initially obtained. Multidetector CT imaging through the chest, abdomen and pelvis was performed using the standard protocol during bolus administration of intravenous contrast. Multiplanar reconstructed images and MIPs were obtained and reviewed to evaluate the vascular anatomy. RADIATION DOSE REDUCTION: This exam was performed according to the departmental dose-optimization program which includes automated exposure control, adjustment of the mA and/or kV according to patient size and/or use of iterative reconstruction technique. CONTRAST:  75mL OMNIPAQUE IOHEXOL 350 MG/ML SOLN COMPARISON:  None Available. FINDINGS: CTA CHEST FINDINGS VASCULAR Aorta: Satisfactory opacification of the aorta. Normal contour and caliber of the thoracic aorta. No evidence of aneurysm, dissection, or other acute aortic pathology. Scattered aortic atherosclerosis. Cardiovascular: No evidence of pulmonary embolism on limited non-tailored examination. Normal heart size. No pericardial effusion. Review of the MIP images confirms the above findings. NON VASCULAR Mediastinum/Nodes:  No enlarged mediastinal, hilar, or axillary lymph nodes. Thyroid gland, trachea, and esophagus demonstrate no significant findings. Lungs/Pleura: Mild bandlike scarring of the bilateral lung bases (series 7, image 46). No pleural effusion or pneumothorax. Musculoskeletal: No chest wall abnormality. No acute osseous findings. Review of the MIP images confirms the above findings. CTA ABDOMEN AND PELVIS FINDINGS VASCULAR Normal contour and caliber of the abdominal aorta. No evidence of aneurysm, dissection, or other acute aortic pathology. Standard branching pattern of the abdominal aorta with solitary bilateral renal arteries. Review of the MIP images confirms the above findings. NON-VASCULAR Hepatobiliary: No  solid liver abnormality is seen. No gallstones, gallbladder wall thickening, or biliary dilatation. Pancreas: Unremarkable. No pancreatic ductal dilatation or surrounding inflammatory changes. Spleen: Normal in size without significant abnormality. Adrenals/Urinary Tract: Adrenal glands are unremarkable. Small nonobstructive calculus of the superior pole of the right kidney. No left-sided calculi, ureteral calculi, or hydronephrosis. Bladder is unremarkable. Stomach/Bowel: Stomach is within normal limits. Thickening and mucosal hyperenhancement of the duodenal bulb (series 5, image 107). Mildly distended loop proximal small bowel in the ventral abdomen, measuring up to 3.4 cm in caliber. There is an abrupt caliber change in the central hemiabdomen, with sharply decompressed small bowel in the right hemiabdomen distal to this loop and transition point (series 5, image 139). Sigmoid diverticulosis. Lymphatic: No enlarged abdominal or pelvic lymph nodes. Reproductive: Calcified uterine fibroids. Other: No abdominal wall hernia or abnormality. Small volume ascites throughout the abdomen and pelvis. Musculoskeletal: No acute osseous findings. IMPRESSION: 1. Normal contour and caliber of the thoracic and abdominal  aorta. No evidence of aneurysm, dissection, or other acute aortic pathology. 2. Mildly distended loop of proximal small bowel in the ventral abdomen, measuring up to 3.4 cm in caliber. There is an abrupt caliber change in the central hemiabdomen, with sharply decompressed small bowel in the right hemiabdomen distal to this loop and transition point. Findings are consistent with small bowel obstruction, possibly due to adhesions, internal herniation not excluded given this appearance. 3. Thickening and mucosal hyperenhancement of the duodenal bulb, consistent with nonspecific infectious or inflammatory duodenitis. 4. Small volume ascites throughout the abdomen and pelvis, likely reactive. 5. Nonobstructive right nephrolithiasis. Electronically Signed   By: Jearld Lesch M.D.   On: 10/16/2022 18:00   US Abdomen Limited RUQ (LIVER/GB)  Result Date: 10/16/2022 CLINICAL DATA:  Right upper quadrant abdominal pain nausea and vomiting for 2 days. EXAM: ULTRASOUND ABDOMEN LIMITED RIGHT UPPER QUADRANT COMPARISON:  None Available. FINDINGS: Gallbladder: No gallstones or wall thickening visualized. No sonographic Murphy sign noted by sonographer. 4 mm polyp in the non dependent gallbladder wall. Common bile duct: Diameter: 3.1 mm, no biliary ductal dilatation. Liver: No focal lesion identified. Within normal limits in parenchymal echogenicity. Portal vein is patent on color Doppler imaging with normal direction of blood flow towards the liver. Other: None. IMPRESSION: 1. No evidence of cholelithiasis or acute cholecystitis. 2. No biliary ductal dilatation. 3. Liver is unremarkable. Electronically Signed   By: Larose Hires D.O.   On: 10/16/2022 16:08    Pending Labs Unresulted Labs (From admission, onward)    None       Vitals/Pain Today's Vitals   10/16/22 2030 10/16/22 2105 10/16/22 2105 10/16/22 2105  BP: (!) 147/61     Pulse: 60     Resp: 16     Temp:    98 F (36.7 C)  TempSrc:    Oral  SpO2: 99%      Weight:      Height:      PainSc:  2  2      Isolation Precautions No active isolations  Medications Medications  ondansetron (ZOFRAN) injection 4 mg (4 mg Intravenous Given 10/16/22 1403)  sodium chloride 0.9 % bolus 1,000 mL (0 mLs Intravenous Stopped 10/16/22 1524)  morphine (PF) 4 MG/ML injection 4 mg (4 mg Intravenous Given 10/16/22 1417)  morphine (PF) 4 MG/ML injection 4 mg (4 mg Intravenous Given 10/16/22 1430)  famotidine (PEPCID) IVPB 20 mg premix (0 mg Intravenous Stopped 10/16/22 1524)  HYDROmorphone (DILAUDID) injection 0.5 mg (0.5 mg Intravenous  Given 10/16/22 1527)  pantoprazole (PROTONIX) injection 40 mg (40 mg Intravenous Given 10/16/22 1743)  lactated ringers bolus 1,000 mL (0 mLs Intravenous Stopped 10/16/22 1837)  morphine (PF) 4 MG/ML injection 4 mg (4 mg Intravenous Given 10/16/22 1743)  iohexol (OMNIPAQUE) 350 MG/ML injection 100 mL (75 mLs Intravenous Contrast Given 10/16/22 1725)  fentaNYL (SUBLIMAZE) injection 100 mcg (100 mcg Intravenous Given 10/16/22 2021)  ondansetron (ZOFRAN) injection 4 mg (4 mg Intravenous Given 10/16/22 2118)    Mobility walks     Focused Assessments Small Bowel Obstruction   R Recommendations: See Admitting Provider Note  Report given to:   Additional Notes:

## 2022-10-16 NOTE — ED Notes (Signed)
Pt gave verbal consent for transport. E signature device was not working.

## 2022-10-16 NOTE — Assessment & Plan Note (Signed)
CT consistent with small bowel obstruction.  Pain and nausea significantly improved on transfer to floor. -Keep n.p.o. -Continue IV fluid hydration overnight -Continue IV antiemetics and analgesics as needed -General surgery to follow

## 2022-10-16 NOTE — ED Provider Notes (Signed)
Samantha Sampson [facility]MENT AT [facility] Provider Note   CSN: [id number] Arrival date & time: 10/16/22  1342     History  Chief Complaint  Patient presents with   Abdominal Pain    Samantha Sampson is a 72 y.o. female.  With PMH of HTN, IBS who presents with right upper quadrant and epigastrium pain since this morning associate with nausea and nonbloody nonbilious emesis.  She has not eaten or drink since yesterday.  She denies any reflux type pain or discomfort.  Denies any chest pain or shortness of breath.  Has been having normal bowel movements since she takes Metamucil daily.  She typically has 2-3 a day.  She has had no melena or bright red blood per rectum.  She has had no fevers or chills.  She has had no history of abdominal surgery.  Denies any history of gallbladder issues.  Denies any pain with urination or hematuria.  Denies any heavy alcohol use.   Abdominal Pain      Home Medications Prior to Admission medications   Not on File      Allergies    Patient has no known allergies.    Review of Systems   Review of Systems  Gastrointestinal:  Positive for abdominal pain.    Physical Exam Updated Vital Signs BP (!) 155/74 (BP Location: Left Arm)   Pulse 64   Temp 98.5 F (36.9 C) (Oral)   Resp 16   Ht 5' 5.5" (1.664 m)   Wt 68 kg   SpO2 100%   BMI 24.58 kg/m  Physical Exam Constitutional: Alert and oriented.  Uncomfortable holding emesis bag but not actively vomiting, nontoxic Eyes: Conjunctivae are normal. ENT      Head: Normocephalic and atraumatic. Cardiovascular: S1, S2, regular rate and rhythm, normal and symmetric distal pulses are present in all extremities.Warm and well perfused. Respiratory: Normal respiratory effort. Breath sounds are normal.  O2 sat 100 on RA Gastrointestinal: Soft and moderate epigastrium tenderness to palpation as well as right upper quadrant tenderness to palpation.  Voluntary guarding present.  Not  peritonitic.  No CVAT. Musculoskeletal: Normal range of motion in all extremities. Neurologic: Normal speech and language.  No facial droop.  Moving all 4 extremities equally.  Sensation grossly intact.  No gross focal neurologic deficits are appreciated. Skin: Skin is warm, dry and intact. No rash noted. Psychiatric: Mood and affect are normal. Speech and behavior are normal.  ED Results / Procedures / Treatments   Labs (all labs ordered are listed, but only abnormal results are displayed) Labs Reviewed  URINALYSIS, ROUTINE W REFLEX MICROSCOPIC - Abnormal; Notable for the following components:      Result Value   Ketones, ur 15 (*)    Protein, ur 100 (*)    All other components within normal limits  COMPREHENSIVE METABOLIC PANEL - Abnormal; Notable for the following components:   Glucose, Bld 190 (*)    Calcium 10.6 (*)    Total Protein 8.2 (*)    All other components within normal limits  CBC - Abnormal; Notable for the following components:   WBC 15.1 (*)    RBC 5.13 (*)    Hemoglobin 15.3 (*)    All other components within normal limits  URINALYSIS, MICROSCOPIC (REFLEX) - Abnormal; Notable for the following components:   Bacteria, UA RARE (*)    All other components within normal limits  LIPASE, BLOOD    EKG EKG Interpretation  Date/Time:  Thursday  Oct 16 2022 14:00:37 EDT Ventricular Rate:  82 PR Interval:  159 QRS Duration: 101 QT Interval:  397 QTC Calculation: 464 R Axis:   59 Text Interpretation: Sinus rhythm Confirmed by Vivien Rossetti ([id number]) on 10/16/2022 2:23:57 PM  Radiology No results found.  Procedures Procedures    Medications Ordered in ED Medications  sodium chloride 0.9 % bolus 1,000 mL (1,000 mLs Intravenous New Bag/Given 10/16/22 1416)  ondansetron (ZOFRAN) injection 4 mg (4 mg Intravenous Given 10/16/22 1403)  morphine (PF) 4 MG/ML injection 4 mg (4 mg Intravenous Given 10/16/22 1417)  morphine (PF) 4 MG/ML injection 4 mg (4 mg Intravenous  Given 10/16/22 1430)  famotidine (PEPCID) IVPB 20 mg premix (20 mg Intravenous New Bag/Given 10/16/22 1442)    ED Course/ Medical Decision Making/ A&P Clinical Course as of 10/16/22 1515  Thu Oct 16, 2022  1454 Labs remarkable for elevated white blood cell count 15.1 as well as elevated hemoglobin 15.3 likely related to hemoconcentration from fluid losses and decreased p.o. intake and vomiting.  UA consistent with dehydration with ketones present and protein present.  Glucose is 190 no anion gap and normal bicarbonate not concern for DKA.  No transaminitis.  Normal T. bili 0.8.  Normal lipase 41, not concern for pancreatitis. [VB]  1508 Handoff report: Stable 72 year old female with a chief complaint of abdominal pain Exquisite abdominal pain on exam, white count 15.  Right upper quadrant ultrasound is ordered if nondiagnostic we will plan to proceed to CT for further diagnostic care and management given concerning nature of initial presentation.  LFTs reassuring patient being treated at this time will reassess. [CC]    Clinical Course User Index [CC] Glyn Ade, MD [VB] Mardene Sayer, MD                             Medical Decision Making Samantha Sampson is a 72 y.o. female.  With PMH of HTN, IBS who presents with right upper quadrant and epigastrium pain since this morning associate with nausea and nonbloody nonbilious emesis.   Based on the patient's right upper quadrant pain, differential includes but is not limited to cholelithiasis, cholecystitis, hepatitis, GERD, PUD, pancreatitis. Less likely nephrolithiasis or pyelonephritis with no CVAT and no urinary symptoms. Less likely pulmonary source such as pneumonia with no cardiopulmonary complaints, no hypoxia, and no increased work of breathing.  EKG reviewed by me with no acute ST/T changes and no cardiopulmonary complaints, doubt ACS.  Labs remarkable for elevated white blood cell count 15.1 as well as elevated hemoglobin  15.3 likely related to hemoconcentration from fluid losses and decreased p.o. intake and vomiting.  UA consistent with dehydration with ketones present and protein present.  Glucose is 190 no anion gap and normal bicarbonate not concern for DKA.  No transaminitis.  Normal T. bili 0.8.  Normal lipase 41, not concern for pancreatitis. [VB]  Signed out to Dr. Doran Durand pending right upper quadrant ultrasound.  If no acute findings, plan for likely CTAP with contrast for further evaluation.   Amount and/or Complexity of Data Reviewed Labs: ordered. Radiology: ordered.  Risk Prescription drug management.    Final Clinical Impression(s) / ED Diagnoses Final diagnoses:  None    Rx / DC Orders ED Discharge Orders     None         Mardene Sayer, MD 10/16/22 1515

## 2022-10-16 NOTE — ED Notes (Signed)
Pt had to use restroom prior to triage. Delay in triage.

## 2022-10-16 NOTE — Hospital Course (Addendum)
Samantha Sampson is a 72 y.o. female with medical history significant for HTN, IBS, chronic mild hypercalcemia who is admitted with suspected small bowel obstruction.  Consultants General surgery   Procedures None

## 2022-10-16 NOTE — Progress Notes (Signed)
Plan of Care Note for accepted transfer   Patient: Samantha Sampson MRN: [id number]   DOA: 10/16/2022  Facility requesting transfer: [facility] Requesting Provider: Glyn Ade, MD (Emergency Medicine) Reason for transfer: Small bowel obstruction Facility course:  72 year old female with history of HTN, chronic mild hypercalcemia presented with new onset nausea, vomiting, abdominal pain this morning.  Labs notable for WBC 15.1, calcium 10.6.  CT imaging consistent with SBO, possibly due to adhesions, internal herniation not excluded.  Changes suspicious for infectious or inflammatory duodenitis also noted.  Patient was given 2 L IV fluids, IV morphine and Dilaudid, Zofran, Protonix and Pepcid.  EDP discussed case with on-call general surgery, Dr. Luisa Hart, who recommended nonoperative management for now with medical admission to either [facility] or [facility].  The hospitalist service was consulted to admit for further evaluation and management.  Plan of care: The patient is accepted for admission to Med-surg  unit, at [facility] or [facility], first available.   Author: Darreld Mclean, MD 10/16/2022  Check [email or web address] for on-call coverage.  Nursing staff, Please call [facility] Admits & Consults System-Wide number on Amion as soon as patient's arrival, so appropriate admitting provider can evaluate the pt.

## 2022-10-16 NOTE — ED Notes (Signed)
[facility], 10 min ETA

## 2022-10-17 ENCOUNTER — Inpatient Hospital Stay (HOSPITAL_COMMUNITY): Payer: HMO

## 2022-10-17 ENCOUNTER — Encounter (HOSPITAL_COMMUNITY): Payer: Self-pay | Admitting: Internal Medicine

## 2022-10-17 DIAGNOSIS — I10 Essential (primary) hypertension: Secondary | ICD-10-CM

## 2022-10-17 DIAGNOSIS — K56609 Unspecified intestinal obstruction, unspecified as to partial versus complete obstruction: Secondary | ICD-10-CM | POA: Diagnosis not present

## 2022-10-17 LAB — CBC
HCT: 40.8 % (ref 36.0–46.0)
Hemoglobin: 13.7 g/dL (ref 12.0–15.0)
MCH: 30.1 pg (ref 26.0–34.0)
MCHC: 33.6 g/dL (ref 30.0–36.0)
MCV: 89.7 fL (ref 80.0–100.0)
Platelets: 189 10*3/uL (ref 150–400)
RBC: 4.55 MIL/uL (ref 3.87–5.11)
RDW: 13.3 % (ref 11.5–15.5)
WBC: 16.5 10*3/uL — ABNORMAL HIGH (ref 4.0–10.5)
nRBC: 0 % (ref 0.0–0.2)

## 2022-10-17 LAB — BASIC METABOLIC PANEL
Anion gap: 8 (ref 5–15)
BUN: 18 mg/dL (ref 8–23)
CO2: 26 mmol/L (ref 22–32)
Calcium: 9.8 mg/dL (ref 8.9–10.3)
Chloride: 106 mmol/L (ref 98–111)
Creatinine, Ser: 0.71 mg/dL (ref 0.44–1.00)
GFR, Estimated: >60 mL/min (ref >60)
Glucose, Bld: 118 mg/dL — ABNORMAL HIGH (ref 70–99)
Potassium: 4 mmol/L (ref 3.5–5.1)
Sodium: 140 mmol/L (ref 135–145)

## 2022-10-17 LAB — HIV ANTIBODY (ROUTINE TESTING W REFLEX): HIV Screen 4th Generation wRfx: NONREACTIVE

## 2022-10-17 MED ORDER — ORAL CARE MOUTH RINSE: 15.0000 mL | OROMUCOSAL | Status: DC | PRN

## 2022-10-17 MED ORDER — KETOROLAC TROMETHAMINE 15 MG/ML IJ SOLN
7.5000 mg | Freq: Once | INTRAMUSCULAR | Status: AC
  Administered 2022-10-17: 7.5 mg via INTRAVENOUS
  Filled 2022-10-17: qty 1

## 2022-10-17 MED ORDER — ASPIRIN-ACETAMINOPHEN-CAFFEINE 250-250-65 MG PO TABS
1.0000 | ORAL_TABLET | Freq: Four times a day (QID) | ORAL | Status: DC | PRN
  Filled 2022-10-17: qty 1

## 2022-10-17 MED ORDER — DIATRIZOATE MEGLUMINE & SODIUM 66-10 % PO SOLN: 90.0000 mL | Freq: Once | ORAL | Status: DC

## 2022-10-17 MED ORDER — DIATRIZOATE MEGLUMINE & SODIUM 66-10 % PO SOLN
90.0000 mL | Freq: Once | ORAL | Status: AC
  Administered 2022-10-17: 90 mL via ORAL
  Filled 2022-10-17: qty 90

## 2022-10-17 MED ORDER — SODIUM CHLORIDE 0.9 % IV SOLN: INTRAVENOUS | Status: DC | PRN

## 2022-10-17 MED ORDER — ACETAMINOPHEN 500 MG PO TABS: 1000.0000 mg | ORAL_TABLET | Freq: Four times a day (QID) | ORAL | Status: DC | PRN

## 2022-10-17 MED ORDER — SODIUM CHLORIDE 0.9 % IV SOLN
2.0000 g | Freq: Every day | INTRAVENOUS | Status: DC
  Administered 2022-10-17: 2 g via INTRAVENOUS
  Filled 2022-10-17: qty 20

## 2022-10-17 MED ORDER — PANTOPRAZOLE SODIUM 40 MG IV SOLR
40.0000 mg | Freq: Every day | INTRAVENOUS | Status: DC
  Administered 2022-10-17: 40 mg via INTRAVENOUS
  Filled 2022-10-17: qty 10

## 2022-10-17 MED ORDER — METRONIDAZOLE 500 MG/100ML IV SOLN
500.0000 mg | Freq: Two times a day (BID) | INTRAVENOUS | Status: DC
  Administered 2022-10-17: 500 mg via INTRAVENOUS
  Filled 2022-10-17: qty 100

## 2022-10-17 MED ORDER — METOPROLOL SUCCINATE ER 50 MG PO TB24
100.0000 mg | ORAL_TABLET | Freq: Every day | ORAL | Status: DC
  Administered 2022-10-17: 100 mg via ORAL
  Filled 2022-10-17 (×2): qty 2

## 2022-10-17 MED ORDER — AMLODIPINE BESYLATE 5 MG PO TABS
5.0000 mg | ORAL_TABLET | Freq: Every day | ORAL | Status: DC
  Administered 2022-10-17: 5 mg via ORAL
  Filled 2022-10-17 (×2): qty 1

## 2022-10-17 NOTE — Progress Notes (Signed)
I.V. team consulted for the second time to start an I.V. for antibiotics. After evaluating patient with ultrasound, patient was not satisfied with where I.V. placement could be attempted. Request RN call M.D. for p.o. meds. Staff RN to notify M.D.

## 2022-10-17 NOTE — Progress Notes (Addendum)
Samantha Sampson 1951-02-14  [id number].    Requesting MD: Samantha Sampson  Chief Complaint/Reason for Consult: SBO  HPI:  Samantha Sampson is a 72 y/o F who presents with abdominal pain and vomiting. Reports acute onset epigastric abdominal pain that started around 0400 yesterday 5/23. Pain described as constant, tight, severe. Tried gas X, pepto bismol, and other OTC meds without relief. Associated with nausea and vomiting. Reports her last BM was 5/22. Denies new foods, sick contacts, or history of SBO. She does have IBS but denies an "attack" in over a year. Takes metamucil every other day and has 2-3 semi-solid, soft stools daily at baseline. Denies urinary sxs or blood in her stool. She denies known history of peptic ulcers but does have a history of esophagitis from acid reflux. Reports taking advil daily, around 800 mg q 8h for arthritis and headaches. Has not had an endoscopy since before COVID. Her father died of colon cancer. Patient is a former cigarette smoker. She smokes marijuana daily to help with headache. Reports occasional EtOH use but not daily. Surgical history of tubal ligation. Denies use of blood thinners.   ROS: Review of Systems  All other systems reviewed and are negative.   History reviewed. No pertinent family history.  Past Medical History:  Diagnosis Date   Hypertension    IBS (irritable bowel syndrome)     History reviewed. No pertinent surgical history.  Social History:  reports that she has never smoked. She has never used smokeless tobacco. She reports current alcohol use. She reports current drug use. Drug: Marijuana.  Allergies: No Known Allergies  Medications Prior to Admission  Medication Sig Dispense Refill   amLODipine (NORVASC) 5 MG tablet Take 5 mg by mouth daily.     Ascorbic Acid (VITAMIN C PO) Take 1 tablet by mouth daily.     Cholecalciferol (VITAMIN D-3 PO) Take 1 tablet by mouth daily.     metoprolol succinate (TOPROL-XL) 100 MG  24 hr tablet Take 100 mg by mouth daily.     Multiple Vitamin (MULTIVITAMIN WITH MINERALS) TABS tablet Take 1 tablet by mouth daily.     Multiple Vitamins-Minerals (ZINC PO) Take 1 tablet by mouth daily.       Physical Exam: Blood pressure (!) 146/66, pulse 60, temperature 99.5 F (37.5 C), resp. rate 18, height 5' 5.5" (1.664 m), weight 68 kg, SpO2 100 %. General: Pleasant white female laying on hospital bed, appears stated age, NAD. HEENT: head -normocephalic, atraumatic; Eyes: PERRLA, no conjunctival injection; anicteric sclerae CV- RRR, no lower extremity edema  Pulm- breathing is non-labored ORA Abd- soft, mild tenderness right mid abdomen without guarding, non-distended, no hernias or masses. GU- deferred  MSK- UE/LE symmetrical, no cyanosis, clubbing, or edema. Neuro- CN II-XII grossly in tact, no paresthesias. Psych- Alert and Oriented x3 with appropriate affect Skin: warm and dry, no rashes or lesions   Results for orders placed or performed during the hospital encounter of 10/16/22 (from the past 48 hour(s))  Urinalysis, Routine w reflex microscopic -Urine, Clean Catch     Status: Abnormal   Collection Time: 10/16/22  1:49 PM  Result Value Ref Range   Color, Urine YELLOW YELLOW   APPearance CLEAR CLEAR   Specific Gravity, Urine 1.020 1.005 - 1.030   pH 7.0 5.0 - 8.0   Glucose, UA NEGATIVE NEGATIVE mg/dL   Hgb urine dipstick NEGATIVE NEGATIVE   Bilirubin Urine NEGATIVE NEGATIVE   Ketones, ur 15 (A) NEGATIVE mg/dL  Protein, ur 100 (A) NEGATIVE mg/dL   Nitrite NEGATIVE NEGATIVE   Leukocytes,Ua NEGATIVE NEGATIVE    Comment: Performed at [facility], [address] [place], [place] [address]  Urinalysis, Microscopic (reflex)     Status: Abnormal   Collection Time: 10/16/22  1:49 PM  Result Value Ref Range   RBC / HPF 0-5 0 - 5 RBC/hpf   WBC, UA 0-5 0 - 5 WBC/hpf   Bacteria, UA RARE (A) NONE SEEN   Squamous Epithelial / HPF 0-5 0 - 5 /HPF   Hyaline Casts,  UA PRESENT     Comment: Performed at [facility], [address] [place], [place] [address]  Lipase, blood     Status: None   Collection Time: 10/16/22  2:05 PM  Result Value Ref Range   Lipase 41 11 - 51 U/L    Comment: Performed at [facility], [address] [place], [place] [address]  Comprehensive metabolic panel     Status: Abnormal   Collection Time: 10/16/22  2:05 PM  Result Value Ref Range   Sodium 138 135 - 145 mmol/L   Potassium 4.2 3.5 - 5.1 mmol/L   Chloride 101 98 - 111 mmol/L   CO2 25 22 - 32 mmol/L   Glucose, Bld 190 (H) 70 - 99 mg/dL    Comment: Glucose reference range applies only to samples taken after fasting for at least 8 hours.   BUN 21 8 - 23 mg/dL   Creatinine, Ser 9.14 0.44 - 1.00 mg/dL   Calcium 78.2 (H) 8.9 - 10.3 mg/dL   Total Protein 8.2 (H) 6.5 - 8.1 g/dL   Albumin 4.6 3.5 - 5.0 g/dL   AST 34 15 - 41 U/L   ALT 27 0 - 44 U/L   Alkaline Phosphatase 81 38 - 126 U/L   Total Bilirubin 0.8 0.3 - 1.2 mg/dL   GFR, Estimated >95 >62 mL/min    Comment: (NOTE) Calculated using the CKD-EPI Creatinine Equation (2021)    Anion gap 12 5 - 15    Comment: Performed at [facility], [address] [place], [place] [address]  CBC     Status: Abnormal   Collection Time: 10/16/22  2:05 PM  Result Value Ref Range   WBC 15.1 (H) 4.0 - 10.5 K/uL   RBC 5.13 (H) 3.87 - 5.11 MIL/uL   Hemoglobin 15.3 (H) 12.0 - 15.0 g/dL   HCT 57.8 46.9 - 62.9 %   MCV 86.9 80.0 - 100.0 fL   MCH 29.8 26.0 - 34.0 pg   MCHC 34.3 30.0 - 36.0 g/dL   RDW 52.8 41.3 - 24.4 %   Platelets 250 150 - 400 K/uL   nRBC 0.0 0.0 - 0.2 %    Comment: Performed at [facility], [address] [place], [place] [address]  CBC     Status: Abnormal   Collection Time: 10/17/22  3:53 AM  Result Value Ref Range   WBC 16.5 (H) 4.0 - 10.5 K/uL   RBC 4.55 3.87 - 5.11 MIL/uL   Hemoglobin 13.7 12.0 - 15.0 g/dL   HCT 25.3 66.4 - 40.3 %   MCV 89.7 80.0 - 100.0 fL    MCH 30.1 26.0 - 34.0 pg   MCHC 33.6 30.0 - 36.0 g/dL   RDW 47.4 25.9 - 56.3 %   Platelets 189 150 - 400 K/uL   nRBC 0.0 0.0 -  0.2 %    Comment: Performed at [facility], [address] [place], [place] [address]  Basic metabolic panel     Status: Abnormal   Collection Time: 10/17/22  3:53 AM  Result Value Ref Range   Sodium 140 135 - 145 mmol/L   Potassium 4.0 3.5 - 5.1 mmol/L   Chloride 106 98 - 111 mmol/L   CO2 26 22 - 32 mmol/L   Glucose, Bld 118 (H) 70 - 99 mg/dL    Comment: Glucose reference range applies only to samples taken after fasting for at least 8 hours.   BUN 18 8 - 23 mg/dL   Creatinine, Ser 6.04 0.44 - 1.00 mg/dL   Calcium 9.8 8.9 - 54.0 mg/dL   GFR, Estimated >98 >11 mL/min    Comment: (NOTE) Calculated using the CKD-EPI Creatinine Equation (2021)    Anion gap 8 5 - 15    Comment: Performed at [facility], [address] [place], [place] [address]  HIV Antibody (routine testing w rflx)     Status: None   Collection Time: 10/17/22  6:48 AM  Result Value Ref Range   HIV Screen 4th Generation wRfx Non Reactive Non Reactive    Comment: Performed at [facility], [address] [place], [place] [address]   CT ANGIO CHEST/ABD/PEL FOR DISSECTION W &/OR WO CONTRAST  Result Date: 10/16/2022 CLINICAL DATA:  Acute aortic syndrome suspected, central abdominal pain radiating to back for 1 day EXAM: CT ANGIOGRAPHY CHEST, ABDOMEN AND PELVIS TECHNIQUE: Non-contrast CT of the chest was initially obtained. Multidetector CT imaging through the chest, abdomen and pelvis was performed using the standard protocol during bolus administration of intravenous contrast. Multiplanar reconstructed images and MIPs were obtained and reviewed to evaluate the vascular anatomy. RADIATION DOSE REDUCTION: This exam was performed according to the departmental dose-optimization program which includes automated exposure control, adjustment of the mA  and/or kV according to patient size and/or use of iterative reconstruction technique. CONTRAST:  75mL OMNIPAQUE IOHEXOL 350 MG/ML SOLN COMPARISON:  None Available. FINDINGS: CTA CHEST FINDINGS VASCULAR Aorta: Satisfactory opacification of the aorta. Normal contour and caliber of the thoracic aorta. No evidence of aneurysm, dissection, or other acute aortic pathology. Scattered aortic atherosclerosis. Cardiovascular: No evidence of pulmonary embolism on limited non-tailored examination. Normal heart size. No pericardial effusion. Review of the MIP images confirms the above findings. NON VASCULAR Mediastinum/Nodes: No enlarged mediastinal, hilar, or axillary lymph nodes. Thyroid gland, trachea, and esophagus demonstrate no significant findings. Lungs/Pleura: Mild bandlike scarring of the bilateral lung bases (series 7, image 46). No pleural effusion or pneumothorax. Musculoskeletal: No chest wall abnormality. No acute osseous findings. Review of the MIP images confirms the above findings. CTA ABDOMEN AND PELVIS FINDINGS VASCULAR Normal contour and caliber of the abdominal aorta. No evidence of aneurysm, dissection, or other acute aortic pathology. Standard branching pattern of the abdominal aorta with solitary bilateral renal arteries. Review of the MIP images confirms the above findings. NON-VASCULAR Hepatobiliary: No solid liver abnormality is seen. No gallstones, gallbladder wall thickening, or biliary dilatation. Pancreas: Unremarkable. No pancreatic ductal dilatation or surrounding inflammatory changes. Spleen: Normal in size without significant abnormality. Adrenals/Urinary Tract: Adrenal glands are unremarkable. Small nonobstructive calculus of the superior pole of the right kidney. No left-sided calculi, ureteral calculi, or hydronephrosis. Bladder is unremarkable. Stomach/Bowel: Stomach is within normal limits. Thickening and mucosal hyperenhancement of the duodenal bulb (series 5, image 107). Mildly  distended loop proximal small bowel in  the ventral abdomen, measuring up to 3.4 cm in caliber. There is an abrupt caliber change in the central hemiabdomen, with sharply decompressed small bowel in the right hemiabdomen distal to this loop and transition point (series 5, image 139). Sigmoid diverticulosis. Lymphatic: No enlarged abdominal or pelvic lymph nodes. Reproductive: Calcified uterine fibroids. Other: No abdominal wall hernia or abnormality. Small volume ascites throughout the abdomen and pelvis. Musculoskeletal: No acute osseous findings. IMPRESSION: 1. Normal contour and caliber of the thoracic and abdominal aorta. No evidence of aneurysm, dissection, or other acute aortic pathology. 2. Mildly distended loop of proximal small bowel in the ventral abdomen, measuring up to 3.4 cm in caliber. There is an abrupt caliber change in the central hemiabdomen, with sharply decompressed small bowel in the right hemiabdomen distal to this loop and transition point. Findings are consistent with small bowel obstruction, possibly due to adhesions, internal herniation not excluded given this appearance. 3. Thickening and mucosal hyperenhancement of the duodenal bulb, consistent with nonspecific infectious or inflammatory duodenitis. 4. Small volume ascites throughout the abdomen and pelvis, likely reactive. 5. Nonobstructive right nephrolithiasis. Electronically Signed   By: Jearld Lesch M.D.   On: 10/16/2022 18:00   US Abdomen Limited RUQ (LIVER/GB)  Result Date: 10/16/2022 CLINICAL DATA:  Right upper quadrant abdominal pain nausea and vomiting for 2 days. EXAM: ULTRASOUND ABDOMEN LIMITED RIGHT UPPER QUADRANT COMPARISON:  None Available. FINDINGS: Gallbladder: No gallstones or wall thickening visualized. No sonographic Murphy sign noted by sonographer. 4 mm polyp in the non dependent gallbladder wall. Common bile duct: Diameter: 3.1 mm, no biliary ductal dilatation. Liver: No focal lesion identified. Within normal  limits in parenchymal echogenicity. Portal vein is patent on color Doppler imaging with normal direction of blood flow towards the liver. Other: None. IMPRESSION: 1. No evidence of cholelithiasis or acute cholecystitis. 2. No biliary ductal dilatation. 3. Liver is unremarkable. Electronically Signed   By: Larose Hires D.O.   On: 10/16/2022 16:08      Assessment/Plan 72 y/o F with possible SBO -afebrile, VSS, WBC 15  - CT yesterday with dilated loop of bowel in upper abdomen and transition zone in central abdomen. Thickening of duodenal bulb without evidence of perforation or fluid collection.  - clinically she is passing gas and her pain has resolved, suspect her obstruction is already resolving with non-op measures. Will do PO SBO protocol and monitor.  - ok for liquid diet  ?duodenitis with history of heavy NSAID use. Hold NSAIDs. Start PPI. Defer empiric abx treatment to medical team. Consider H. Pylori test. Will need GI follow up.     I reviewed nursing notes, hospitalist notes, last 24 h vitals and pain scores, last 48 h intake and output, last 24 h labs and trends, and last 24 h imaging results.  Adam Phenix, PA-C [facility] 10/17/2022, 12:00 PM Please see Amion for pager number during day hours 7:00am-4:30pm or 7:00am -11:30am on weekends

## 2022-10-17 NOTE — Progress Notes (Signed)
I.V. team consulted for the second time today in an attempt to start a new I.V. for antibiotics. Patient not satisfied with where vein found with ultrasound for placement. Request staff RN call M.D. for p.o. order for  antibiotics.

## 2022-10-17 NOTE — Plan of Care (Signed)
  Problem: Clinical Measurements: Goal: Ability to maintain clinical measurements within normal limits will improve Outcome: Progressing   Problem: Pain Managment: Goal: General experience of comfort will improve Outcome: Progressing   Problem: Safety: Goal: Ability to remain free from injury will improve Outcome: Progressing   

## 2022-10-17 NOTE — Progress Notes (Signed)
VAST consult received to obtain IV access. Upon arrival at bedside, patient asleep. Awakened patient and she was confused and asking, "where is my husband".  Allowed patient to get her bearings and attempted to answer her questions. She asked, "why are you starting an IV". I advised her she was receiving antibiotics. She continued with questions regarding IV access and then stated, "you are not starting an IV on me". I advised that she has the right to refuse, but that she would not be able to receive her medications as ordered. Unit RN came in to bedside. Informed unit RN of patient's refusal for IV at this time.

## 2022-10-17 NOTE — Progress Notes (Signed)
  Progress Note   Patient: Samantha Sampson ZOX:[id number] DOB: [birth year] DOA: 10/16/2022     1 DOS: the patient was seen and examined on 10/17/2022   Brief hospital course: KINZY LEIDECKER is a 72 y.o. female with medical history significant for HTN, IBS, chronic mild hypercalcemia who is admitted with suspected small bowel obstruction.  Consultants General surgery   Procedures None   Assessment and Plan: * Small bowel obstruction (HCC) CT consistent with small bowel obstruction. Abdominal u/s unremarkable.  Pain and nausea significantly improved on transfer to floor. General surgery doubts obstruction at this time.  Small bowel protocol.  Nonspecific infectious or inflammatory duodenitis.  Patient does use NSAIDs significantly.  However she has no abdominal pain now.  Will treat with empiric antibiotics and follow clinically.  Recommend outpatient GI evaluation unless clinical condition worsens.  Hypercalcemia Chronic and mild, monitored by her PCP as an outpatient. Within normal limits today.  Follow-up as an outpatient.  Essential hypertension Resume amlodipine and Toprol XL now that patient is on clear liquids.     Subjective:  Feels a lot better No pain No vomiting Passing gas No BM yet  Physical Exam: Vitals:   10/17/22 0218 10/17/22 0618 10/17/22 0945 10/17/22 1357  BP: (!) 143/75 (!) 149/76 (!) 146/66 (!) 160/69  Pulse: 72 65 60 79  Resp: 15 15 18 18   Temp: (!) 97.5 F (36.4 C) 98.4 F (36.9 C) 99.5 F (37.5 C) 97.6 F (36.4 C)  TempSrc:    Oral  SpO2: 100% 100% 100% 100%  Weight:      Height:       Physical Exam Vitals reviewed.  Constitutional:      General: She is not in acute distress.    Appearance: She is not ill-appearing or toxic-appearing.  Cardiovascular:     Rate and Rhythm: Normal rate and regular rhythm.     Heart sounds: No murmur heard. Pulmonary:     Effort: Pulmonary effort is normal. No respiratory distress.     Breath sounds:  No wheezing, rhonchi or rales.  Neurological:     Mental Status: She is alert.  Psychiatric:        Behavior: Behavior normal.     Data Reviewed: BMP noted WBC 16.5   Family Communication: husband at bedside  Disposition: Status is: Inpatient Remains inpatient appropriate because: SBO?  Planned Discharge Destination: Home    Time spent: 35 minutes  Author: Brendia Sacks, MD 10/17/2022 5:43 PM  For on call review [email or web address].

## 2022-10-17 NOTE — TOC CM/SW Note (Signed)
  Transition of Care [facility]) Screening Note   Patient Details  Name: Samantha Sampson Date of Birth: Oct 06, 1950   Transition of Care [facility]) CM/SW Contact:    Amada Jupiter, LCSW Phone Number: 10/17/2022, 1:56 PM    Transition of Care Department [facility]) has reviewed patient and no TOC needs have been identified at this time. We will continue to monitor patient advancement through interdisciplinary progression rounds. If new patient transition needs arise, please place a TOC consult.

## 2022-10-18 DIAGNOSIS — K2980 Duodenitis without bleeding: Secondary | ICD-10-CM

## 2022-10-18 DIAGNOSIS — K56609 Unspecified intestinal obstruction, unspecified as to partial versus complete obstruction: Secondary | ICD-10-CM | POA: Diagnosis not present

## 2022-10-18 LAB — CBC
HCT: 43.4 % (ref 36.0–46.0)
Hemoglobin: 14.4 g/dL (ref 12.0–15.0)
MCH: 30.1 pg (ref 26.0–34.0)
MCHC: 33.2 g/dL (ref 30.0–36.0)
MCV: 90.6 fL (ref 80.0–100.0)
Platelets: 213 10*3/uL (ref 150–400)
RBC: 4.79 MIL/uL (ref 3.87–5.11)
RDW: 13.5 % (ref 11.5–15.5)
WBC: 11.1 10*3/uL — ABNORMAL HIGH (ref 4.0–10.5)
nRBC: 0 % (ref 0.0–0.2)

## 2022-10-18 MED ORDER — METRONIDAZOLE 500 MG PO TABS
500.0000 mg | ORAL_TABLET | Freq: Two times a day (BID) | ORAL | Status: DC
  Administered 2022-10-18: 500 mg via ORAL
  Filled 2022-10-18 (×2): qty 1

## 2022-10-18 MED ORDER — PANTOPRAZOLE SODIUM 40 MG PO TBEC: 40.0000 mg | DELAYED_RELEASE_TABLET | Freq: Every day | ORAL | 0 refills | Status: AC

## 2022-10-18 MED ORDER — AMOXICILLIN-POT CLAVULANATE 875-125 MG PO TABS: 1.0000 | ORAL_TABLET | Freq: Two times a day (BID) | ORAL | 0 refills | Status: AC

## 2022-10-18 NOTE — Plan of Care (Signed)
  Problem: Health Behavior/Discharge Planning: Goal: Ability to manage health-related needs will improve Outcome: Progressing   Problem: Coping: Goal: Level of anxiety will decrease Outcome: Progressing   

## 2022-10-18 NOTE — Progress Notes (Signed)
Small bowel obstruction (HCC)  Subjective: Tolerating fulls  Objective: Vital signs in last 24 hours: Temp:  [97.6 F (36.4 C)-99.9 F (37.7 C)] 99.9 F (37.7 C) (05/25 0604) Pulse Rate:  [59-79] 65 (05/25 0604) Resp:  [17-18] 17 (05/25 0604) BP: (141-160)/(66-72) 141/72 (05/25 0604) SpO2:  [100 %] 100 % (05/25 0604) Last BM Date : 10/17/22  Intake/Output from previous day: 05/24 0701 - 05/25 0700 In: 1047.7 [P.O.:480; I.V.:367.7; IV Piggyback:200] Out: -  Intake/Output this shift: No intake/output data recorded.  General appearance: alert and cooperative GI: soft, non-distended  Lab Results:  No results found for this or any previous visit (from the past 24 hour(s)).   Studies/Results Radiology     MEDS, Scheduled  amLODipine  5 mg Oral Daily   enoxaparin (LOVENOX) injection  40 mg Subcutaneous Q24H   metoprolol succinate  100 mg Oral Daily   metroNIDAZOLE  500 mg Oral Q12H   pantoprazole (PROTONIX) IV  40 mg Intravenous QHS   sodium chloride flush  3 mL Intravenous Q12H     Assessment: Small bowel obstruction (HCC) Seems to be resolved.  AXR normal with contrast in colon  Plan: Advance diet as duodenitis allows No surgical issues currently.  Will sign off Please call with questions or concerns  LOS: 2 days    Vanita Panda, MD [facility]  Patient's medical decision making was straightforward (25 mins met or exceeded with patient care and documentation).   10/18/2022 8:55 AM

## 2022-10-18 NOTE — Discharge Summary (Signed)
Physician Discharge Summary   Patient: Samantha Sampson MRN: [id number] DOB: 1950-07-01  Admit date:     10/16/2022  Discharge date: 10/18/22  Discharge Physician: Brendia Sacks   PCP: Shellia Cleverly, PA   Recommendations at discharge:     Nonspecific infectious or inflammatory duodenitis.  Patient does use NSAIDs significantly.  However she has no abdominal pain now.  Will treat with empiric antibiotics and follow clinically.  Recommend outpatient GI evaluation unless clinical condition worsens. Empiric PPI on discharge.  Discharge Diagnoses: Principal Problem:   Small bowel obstruction (HCC) Active Problems:   Hypercalcemia   Leukocytosis   Hypertension *Possible small bowel obstruction (HCC) Nonspecific infectious or inflammatory duodenitis.  Resolved Problems:   * No resolved hospital problems. *  Hospital Course: Samantha Sampson is a 72 y.o. female with medical history significant for HTN, IBS, chronic mild hypercalcemia who is admitted with suspected small bowel obstruction.  Seen by general surgery, treated with small bowel protocol, small bowel obstruction was ultimately doubted.  Symptoms rapidly resolved and she was cleared for discharge home.  Consultants General surgery   Procedures None   *Possible small bowel obstruction (HCC) CT consistent with small bowel obstruction. Abdominal u/s unremarkable.  Pain and nausea significantly improved on transfer to floor. General surgery doubts obstruction at this time.  Small bowel protocol performed, follow-up x-ray was negative for obstruction. Asymptomatic.  No further evaluation suggested.   Nonspecific infectious or inflammatory duodenitis.  Patient does use NSAIDs significantly.  However she has no abdominal pain now.  Will treat with empiric antibiotics and follow clinically.  Recommend outpatient GI evaluation unless clinical condition worsens. Empiric PPI on discharge.   Hypercalcemia Chronic and mild,  monitored by her PCP as an outpatient. Within normal limits today.  Follow-up as an outpatient.   Essential hypertension Resume amlodipine and Toprol XL  Disposition: Home Diet recommendation:  Discharge Diet Orders (From admission, onward)     Start     Ordered   10/18/22 0000  Diet general        10/18/22 1018           Regular diet DISCHARGE MEDICATION: Allergies as of 10/18/2022   No Known Allergies      Medication List     TAKE these medications    amLODipine 5 MG tablet Commonly known as: NORVASC Take 5 mg by mouth daily.   amoxicillin-clavulanate 875-125 MG tablet Commonly known as: AUGMENTIN Take 1 tablet by mouth 2 (two) times daily for 4 days.   metoprolol succinate 100 MG 24 hr tablet Commonly known as: TOPROL-XL Take 100 mg by mouth daily.   multivitamin with minerals Tabs tablet Take 1 tablet by mouth daily.   pantoprazole 40 MG tablet Commonly known as: Protonix Take 1 tablet (40 mg total) by mouth daily.   VITAMIN C PO Take 1 tablet by mouth daily.   VITAMIN D-3 PO Take 1 tablet by mouth daily.   ZINC PO Take 1 tablet by mouth daily.        Follow-up Information     Shellia Cleverly, [place]. Schedule an appointment as soon as possible for a visit in 1 week(s).   Specialty: General Practice Contact information: [address] [place] [address] [phone]                Feels better No pain  Discharge Exam: Filed Weights   10/16/22 1350  Weight: 68 kg   Physical Exam Vitals reviewed.  Condition at discharge: good  The results of significant diagnostics from this hospitalization (including imaging, microbiology, ancillary and laboratory) are listed below for reference.   Imaging Studies: DG Abd Portable 1V-Small Bowel Obstruction Protocol-initial, 8 hr delay  Result Date: 10/17/2022 CLINICAL DATA:  Small-bowel obstruction. EXAM: PORTABLE ABDOMEN - 1 VIEW COMPARISON:  10/16/2022. FINDINGS: The bowel  gas pattern is normal. Contrast is identified in the colon and rectum. Scattered diverticula are present along the sigmoid colon. No radio-opaque calculi or other significant radiographic abnormality are seen. IMPRESSION: 1. No evidence of bowel obstruction. 2. Sigmoid diverticulosis. Electronically Signed   By: Thornell Sartorius M.D.   On: 10/17/2022 21:22   CT ANGIO CHEST/ABD/PEL FOR DISSECTION W &/OR WO CONTRAST  Result Date: 10/16/2022 CLINICAL DATA:  Acute aortic syndrome suspected, central abdominal pain radiating to back for 1 day EXAM: CT ANGIOGRAPHY CHEST, ABDOMEN AND PELVIS TECHNIQUE: Non-contrast CT of the chest was initially obtained. Multidetector CT imaging through the chest, abdomen and pelvis was performed using the standard protocol during bolus administration of intravenous contrast. Multiplanar reconstructed images and MIPs were obtained and reviewed to evaluate the vascular anatomy. RADIATION DOSE REDUCTION: This exam was performed according to the departmental dose-optimization program which includes automated exposure control, adjustment of the mA and/or kV according to patient size and/or use of iterative reconstruction technique. CONTRAST:  75mL OMNIPAQUE IOHEXOL 350 MG/ML SOLN COMPARISON:  None Available. FINDINGS: CTA CHEST FINDINGS VASCULAR Aorta: Satisfactory opacification of the aorta. Normal contour and caliber of the thoracic aorta. No evidence of aneurysm, dissection, or other acute aortic pathology. Scattered aortic atherosclerosis. Cardiovascular: No evidence of pulmonary embolism on limited non-tailored examination. Normal heart size. No pericardial effusion. Review of the MIP images confirms the above findings. NON VASCULAR Mediastinum/Nodes: No enlarged mediastinal, hilar, or axillary lymph nodes. Thyroid gland, trachea, and esophagus demonstrate no significant findings. Lungs/Pleura: Mild bandlike scarring of the bilateral lung bases (series 7, image 46). No pleural effusion or  pneumothorax. Musculoskeletal: No chest wall abnormality. No acute osseous findings. Review of the MIP images confirms the above findings. CTA ABDOMEN AND PELVIS FINDINGS VASCULAR Normal contour and caliber of the abdominal aorta. No evidence of aneurysm, dissection, or other acute aortic pathology. Standard branching pattern of the abdominal aorta with solitary bilateral renal arteries. Review of the MIP images confirms the above findings. NON-VASCULAR Hepatobiliary: No solid liver abnormality is seen. No gallstones, gallbladder wall thickening, or biliary dilatation. Pancreas: Unremarkable. No pancreatic ductal dilatation or surrounding inflammatory changes. Spleen: Normal in size without significant abnormality. Adrenals/Urinary Tract: Adrenal glands are unremarkable. Small nonobstructive calculus of the superior pole of the right kidney. No left-sided calculi, ureteral calculi, or hydronephrosis. Bladder is unremarkable. Stomach/Bowel: Stomach is within normal limits. Thickening and mucosal hyperenhancement of the duodenal bulb (series 5, image 107). Mildly distended loop proximal small bowel in the ventral abdomen, measuring up to 3.4 cm in caliber. There is an abrupt caliber change in the central hemiabdomen, with sharply decompressed small bowel in the right hemiabdomen distal to this loop and transition point (series 5, image 139). Sigmoid diverticulosis. Lymphatic: No enlarged abdominal or pelvic lymph nodes. Reproductive: Calcified uterine fibroids. Other: No abdominal wall hernia or abnormality. Small volume ascites throughout the abdomen and pelvis. Musculoskeletal: No acute osseous findings. IMPRESSION: 1. Normal contour and caliber of the thoracic and abdominal aorta. No evidence of aneurysm, dissection, or other acute aortic pathology. 2. Mildly distended loop of proximal small bowel in the ventral abdomen, measuring up to 3.4 cm in  caliber. There is an abrupt caliber change in the central  hemiabdomen, with sharply decompressed small bowel in the right hemiabdomen distal to this loop and transition point. Findings are consistent with small bowel obstruction, possibly due to adhesions, internal herniation not excluded given this appearance. 3. Thickening and mucosal hyperenhancement of the duodenal bulb, consistent with nonspecific infectious or inflammatory duodenitis. 4. Small volume ascites throughout the abdomen and pelvis, likely reactive. 5. Nonobstructive right nephrolithiasis. Electronically Signed   By: Jearld Lesch M.D.   On: 10/16/2022 18:00   US Abdomen Limited RUQ (LIVER/GB)  Result Date: 10/16/2022 CLINICAL DATA:  Right upper quadrant abdominal pain nausea and vomiting for 2 days. EXAM: ULTRASOUND ABDOMEN LIMITED RIGHT UPPER QUADRANT COMPARISON:  None Available. FINDINGS: Gallbladder: No gallstones or wall thickening visualized. No sonographic Murphy sign noted by sonographer. 4 mm polyp in the non dependent gallbladder wall. Common bile duct: Diameter: 3.1 mm, no biliary ductal dilatation. Liver: No focal lesion identified. Within normal limits in parenchymal echogenicity. Portal vein is patent on color Doppler imaging with normal direction of blood flow towards the liver. Other: None. IMPRESSION: 1. No evidence of cholelithiasis or acute cholecystitis. 2. No biliary ductal dilatation. 3. Liver is unremarkable. Electronically Signed   By: Larose Hires D.O.   On: 10/16/2022 16:08    Microbiology: No results found for this or any previous visit.  Labs: CBC: Recent Labs  Lab 10/16/22 1405 10/17/22 0353 10/18/22 1000  WBC 15.1* 16.5* 11.1*  HGB 15.3* 13.7 14.4  HCT 44.6 40.8 43.4  MCV 86.9 89.7 90.6  PLT 250 189 213   Basic Metabolic Panel: Recent Labs  Lab 10/16/22 1405 10/17/22 0353  NA 138 140  K 4.2 4.0  CL 101 106  CO2 25 26  GLUCOSE 190* 118*  BUN 21 18  CREATININE 0.83 0.71  CALCIUM 10.6* 9.8   Liver Function Tests: Recent Labs  Lab  10/16/22 1405  AST 34  ALT 27  ALKPHOS 81  BILITOT 0.8  PROT 8.2*  ALBUMIN 4.6   CBG: No results for input(s): "GLUCAP" in the last 168 hours.  Discharge time spent: greater than 30 minutes.  Signed: Brendia Sacks, MD [facility] 10/18/2022

## 2022-10-18 NOTE — Progress Notes (Signed)
Pt alert and oriented. No questions or queries regarding discharge instructions. Belongings given back to pt at the time of discharge.
